# Patient Record
Sex: Female | Born: 1950 | Race: White | Hispanic: No | Marital: Married | Smoking: Former smoker
Health system: Southern US, Community
[De-identification: ages and names within clinical notes are randomized; demographics above are authoritative.]

## PROBLEM LIST (undated history)

## (undated) DIAGNOSIS — E785 Hyperlipidemia, unspecified: Secondary | ICD-10-CM

## (undated) DIAGNOSIS — E079 Disorder of thyroid, unspecified: Secondary | ICD-10-CM

## (undated) DIAGNOSIS — M8580 Other specified disorders of bone density and structure, unspecified site: Secondary | ICD-10-CM

## (undated) HISTORY — PX: TONSILLECTOMY: SUR1361

## (undated) HISTORY — DX: Other specified disorders of bone density and structure, unspecified site: M85.80

## (undated) HISTORY — PX: APPENDECTOMY: SHX54

## (undated) HISTORY — DX: Disorder of thyroid, unspecified: E07.9

## (undated) HISTORY — PX: MYOTOMY: SHX1013

## (undated) HISTORY — PX: RHINOPLASTY: SUR1284

## (undated) HISTORY — DX: Hyperlipidemia, unspecified: E78.5

---

## 2013-02-10 ENCOUNTER — Other Ambulatory Visit (HOSPITAL_COMMUNITY): Payer: Self-pay | Admitting: Obstetrics and Gynecology

## 2013-02-10 DIAGNOSIS — E041 Nontoxic single thyroid nodule: Secondary | ICD-10-CM

## 2013-02-10 DIAGNOSIS — E042 Nontoxic multinodular goiter: Secondary | ICD-10-CM

## 2013-02-15 ENCOUNTER — Ambulatory Visit (HOSPITAL_COMMUNITY): Payer: BC Managed Care – PPO

## 2013-04-12 ENCOUNTER — Ambulatory Visit (HOSPITAL_COMMUNITY)
Admission: RE | Admit: 2013-04-12 | Discharge: 2013-04-12 | Disposition: A | Discharge status: Home / Self Care | Payer: BC Managed Care – PPO | Source: Ambulatory Visit | Attending: Obstetrics and Gynecology | Admitting: Obstetrics and Gynecology

## 2013-04-12 DIAGNOSIS — E042 Nontoxic multinodular goiter: Secondary | ICD-10-CM | POA: Insufficient documentation

## 2013-04-12 DIAGNOSIS — E041 Nontoxic single thyroid nodule: Secondary | ICD-10-CM

## 2013-04-18 ENCOUNTER — Other Ambulatory Visit: Payer: Self-pay | Admitting: Obstetrics and Gynecology

## 2013-04-18 DIAGNOSIS — E041 Nontoxic single thyroid nodule: Secondary | ICD-10-CM

## 2013-04-27 ENCOUNTER — Other Ambulatory Visit (HOSPITAL_COMMUNITY)
Admission: RE | Admit: 2013-04-27 | Discharge: 2013-04-27 | Disposition: A | Discharge status: Home / Self Care | Payer: BC Managed Care – PPO | Source: Ambulatory Visit | Attending: Interventional Radiology | Admitting: Interventional Radiology

## 2013-04-27 ENCOUNTER — Ambulatory Visit
Admission: RE | Admit: 2013-04-27 | Discharge: 2013-04-27 | Disposition: A | Discharge status: Home / Self Care | Payer: BC Managed Care – PPO | Source: Ambulatory Visit | Attending: Obstetrics and Gynecology | Admitting: Obstetrics and Gynecology

## 2013-04-27 DIAGNOSIS — E041 Nontoxic single thyroid nodule: Secondary | ICD-10-CM

## 2013-04-27 DIAGNOSIS — E049 Nontoxic goiter, unspecified: Secondary | ICD-10-CM | POA: Insufficient documentation

## 2013-05-01 ENCOUNTER — Encounter (INDEPENDENT_AMBULATORY_CARE_PROVIDER_SITE_OTHER): Payer: Self-pay | Admitting: Surgery

## 2013-05-01 ENCOUNTER — Ambulatory Visit (INDEPENDENT_AMBULATORY_CARE_PROVIDER_SITE_OTHER): Payer: BC Managed Care – PPO | Admitting: Surgery

## 2013-05-01 VITALS — BP 128/82 | HR 68 | Temp 98.2°F | Resp 14 | Ht 62.5 in | Wt 156.4 lb

## 2013-05-01 DIAGNOSIS — E042 Nontoxic multinodular goiter: Secondary | ICD-10-CM | POA: Insufficient documentation

## 2013-05-01 NOTE — Progress Notes (Signed)
General Surgery [facility]  Chief Complaint  Patient presents with  . New Evaluation    multinodular goiter - referral from Dr. Arvella Nigh    HISTORY: Patient is a 64 year old female referred by her gynecologist for evaluation of multinodular thyroid goiter. This was originally diagnosed 8 years ago in [place]. Patient noted nodules on self-examination. She underwent ultrasound exam followed by fine-needle aspiration biopsy which is reportedly negative. Patient was then followed with sequential physical exam and ultrasound examination. Patient has never been on thyroid medication. She has had no surgery on the head or neck. She does have a family history of thyroid disease in her sister who is hypothyroid. There is no family history of thyroid malignancy. There is no history of endocrine neoplasms.  Patient describes rare dysphagia. She denies tremor. She denies palpitations.  Patient underwent thyroid ultrasound which shows a enlarged right lobe measuring 5.9 cm. Dominant nodules in the right lobe measuring 1.7 cm and 1.9 cm respectively. Left thyroid lobe is normal in size. Largest nodule on the left measures 1.1 cm. Patient subsequently underwent ultrasound-guided fine-needle aspiration biopsy of the right thyroid lobe nodule. Final cytopathology showed benign results consistent with nonneoplastic goiter.  Past Medical History  Diagnosis Date  . Osteopenia   . Thyroid disease   . Hyperlipidemia     Current Outpatient Prescriptions  Medication Sig Dispense Refill  . atorvastatin (LIPITOR) 10 MG tablet Take 10 mg by mouth daily.      . cetirizine (ZYRTEC) 5 MG tablet Take 5 mg by mouth daily.      . cycloSPORINE (RESTASIS) 0.05 % ophthalmic emulsion 1 drop 2 (two) times daily.      . Multiple Minerals-Vitamins (CALCIUM & VIT D3 BONE HEALTH PO) Take by mouth.       No current facility-administered medications for this visit.    No Known Allergies  Family  History  Problem Relation Age of Onset  . Melanoma Brother   . Breast cancer Sister   . Ovarian cancer Maternal Grandmother   . Colon cancer Paternal Grandfather   . Heart attack Father     History   Social History  . Marital Status: Married    Spouse Name: N/A    Number of Children: N/A  . Years of Education: N/A   Social History Main Topics  . Smoking status: Former Smoker    Quit date: 03/30/1978  . Smokeless tobacco: None  . Alcohol Use: Yes  . Drug Use: No  . Sexual Activity: None   Other Topics Concern  . None   Social History Narrative  . None    REVIEW OF SYSTEMS - PERTINENT POSITIVES ONLY: Rare dysphagia. Denies tremor. Denies palpitation. Denies new topical masses.  EXAM: Filed Vitals:   05/01/13 1140  BP: 128/82  Pulse: 68  Temp: 98.2 F (36.8 C)  Resp: 14    GENERAL: well-developed, well-nourished, no acute distress HEENT: normocephalic; pupils equal and reactive; sclerae clear; dentition good; mucous membranes moist NECK:  Palpable nodules right thyroid lobe, approximately 1 cm, mobile, firm, nontender; left thyroid lobe without palpable abnormality; symmetric on extension; no palpable anterior or posterior cervical lymphadenopathy; no supraclavicular masses; no tenderness CHEST: clear to auscultation bilaterally without rales, rhonchi, or wheezes CARDIAC: regular rate and rhythm without significant murmur; peripheral pulses are full EXT:  non-tender without edema; no deformity NEURO: no gross focal deficits; no sign of tremor   LABORATORY RESULTS: See [facility] (CHL-Epic) for most recent results  RADIOLOGY RESULTS: See [facility] (CHL-Epic) for most recent results  IMPRESSION: Bilateral thyroid nodules, benign cytopathology, clinically stable  PLAN: The patient and I discussed options for management. At this point I think it is safe to observe her with annual ultrasound examination and TSH determination and physical examination.  We discussed other options for management including thyroid hormone suppression and thyroid surgery. Patient is comfortable with continued observation. We will make arrangements for follow-up in one year.  Earnstine Regal, MD, [facility]  Primary Care Physician: Darlyn Chamber, MD

## 2013-05-01 NOTE — Patient Instructions (Signed)

## 2013-05-11 ENCOUNTER — Encounter (INDEPENDENT_AMBULATORY_CARE_PROVIDER_SITE_OTHER): Payer: Self-pay | Admitting: Obstetrics and Gynecology

## 2014-04-19 ENCOUNTER — Other Ambulatory Visit: Payer: Self-pay | Admitting: Obstetrics and Gynecology

## 2014-04-20 LAB — CYTOLOGY - PAP

## 2015-03-01 ENCOUNTER — Other Ambulatory Visit: Payer: Self-pay | Admitting: Surgery

## 2015-03-01 DIAGNOSIS — E042 Nontoxic multinodular goiter: Secondary | ICD-10-CM

## 2015-03-19 DIAGNOSIS — E782 Mixed hyperlipidemia: Secondary | ICD-10-CM | POA: Diagnosis not present

## 2015-03-19 DIAGNOSIS — K7689 Other specified diseases of liver: Secondary | ICD-10-CM | POA: Diagnosis not present

## 2015-03-19 DIAGNOSIS — R7301 Impaired fasting glucose: Secondary | ICD-10-CM | POA: Diagnosis not present

## 2015-03-19 DIAGNOSIS — E042 Nontoxic multinodular goiter: Secondary | ICD-10-CM | POA: Diagnosis not present

## 2015-04-05 ENCOUNTER — Ambulatory Visit
Admission: RE | Admit: 2015-04-05 | Discharge: 2015-04-05 | Disposition: A | Discharge status: Home / Self Care | Payer: Self-pay | Source: Ambulatory Visit | Attending: Surgery | Admitting: Surgery

## 2015-04-05 DIAGNOSIS — E042 Nontoxic multinodular goiter: Secondary | ICD-10-CM

## 2015-05-06 DIAGNOSIS — E042 Nontoxic multinodular goiter: Secondary | ICD-10-CM | POA: Diagnosis not present

## 2015-06-13 DIAGNOSIS — S0500XA Injury of conjunctiva and corneal abrasion without foreign body, unspecified eye, initial encounter: Secondary | ICD-10-CM | POA: Diagnosis not present

## 2015-06-13 DIAGNOSIS — H16223 Keratoconjunctivitis sicca, not specified as Sjogren's, bilateral: Secondary | ICD-10-CM | POA: Diagnosis not present

## 2015-06-13 DIAGNOSIS — H11152 Pinguecula, left eye: Secondary | ICD-10-CM | POA: Diagnosis not present

## 2015-10-25 DIAGNOSIS — M8580 Other specified disorders of bone density and structure, unspecified site: Secondary | ICD-10-CM | POA: Diagnosis not present

## 2015-10-25 DIAGNOSIS — Z0000 Encounter for general adult medical examination without abnormal findings: Secondary | ICD-10-CM | POA: Diagnosis not present

## 2015-10-25 DIAGNOSIS — E042 Nontoxic multinodular goiter: Secondary | ICD-10-CM | POA: Diagnosis not present

## 2015-10-25 DIAGNOSIS — E782 Mixed hyperlipidemia: Secondary | ICD-10-CM | POA: Diagnosis not present

## 2015-11-25 ENCOUNTER — Encounter (HOSPITAL_COMMUNITY): Payer: Self-pay | Admitting: Emergency Medicine

## 2015-11-25 ENCOUNTER — Emergency Department (HOSPITAL_COMMUNITY): Payer: Medicare Other

## 2015-11-25 ENCOUNTER — Emergency Department (HOSPITAL_COMMUNITY)
Admission: EM | Admit: 2015-11-25 | Discharge: 2015-11-25 | Disposition: A | Discharge status: Home / Self Care | Payer: Medicare Other | Attending: Emergency Medicine | Admitting: Emergency Medicine

## 2015-11-25 DIAGNOSIS — W010XXA Fall on same level from slipping, tripping and stumbling without subsequent striking against object, initial encounter: Secondary | ICD-10-CM | POA: Diagnosis not present

## 2015-11-25 DIAGNOSIS — S92351A Displaced fracture of fifth metatarsal bone, right foot, initial encounter for closed fracture: Secondary | ICD-10-CM | POA: Diagnosis not present

## 2015-11-25 DIAGNOSIS — M7989 Other specified soft tissue disorders: Secondary | ICD-10-CM | POA: Diagnosis not present

## 2015-11-25 DIAGNOSIS — Y929 Unspecified place or not applicable: Secondary | ICD-10-CM | POA: Diagnosis not present

## 2015-11-25 DIAGNOSIS — S99911A Unspecified injury of right ankle, initial encounter: Secondary | ICD-10-CM | POA: Diagnosis present

## 2015-11-25 DIAGNOSIS — S92301A Fracture of unspecified metatarsal bone(s), right foot, initial encounter for closed fracture: Secondary | ICD-10-CM

## 2015-11-25 DIAGNOSIS — Z87891 Personal history of nicotine dependence: Secondary | ICD-10-CM | POA: Insufficient documentation

## 2015-11-25 DIAGNOSIS — Y999 Unspecified external cause status: Secondary | ICD-10-CM | POA: Insufficient documentation

## 2015-11-25 DIAGNOSIS — Y939 Activity, unspecified: Secondary | ICD-10-CM | POA: Diagnosis not present

## 2015-11-25 DIAGNOSIS — M25571 Pain in right ankle and joints of right foot: Secondary | ICD-10-CM | POA: Diagnosis not present

## 2015-11-25 MED ORDER — TRAMADOL HCL 50 MG PO TABS
50.0000 mg | ORAL_TABLET | Freq: Once | ORAL | Status: AC
Start: 2015-11-25 — End: 2015-11-25
  Administered 2015-11-25: 50 mg via ORAL
  Filled 2015-11-25: qty 1

## 2015-11-25 MED ORDER — TRAMADOL HCL 50 MG PO TABS: 50.0000 mg | ORAL_TABLET | Freq: Four times a day (QID) | ORAL | 0 refills | Status: DC | PRN

## 2015-11-25 NOTE — Discharge Instructions (Signed)
Take the prescribed medication as directed.  You may supplement this with Tylenol or Motrin in between doses if needed. Recommend to ice and elevate right foot several times a day for 20 minutes at a time.  This should help with swelling and pain. Follow-up with Dr. Ninfa Linden next week-- call his office to make an appointment. Return to the ED for new or worsening symptoms.

## 2015-11-25 NOTE — ED Provider Notes (Signed)
[facility] DEPT Provider Note   CSN: [id number] Arrival date & time: 11/25/15  1053  By signing my name below, I, Judithe Modest, attest that this documentation has been prepared under the direction and in the presence of Quincy Carnes, PA-C. Electronically Signed: Judithe Modest, ER Scribe. 11/09/2015. 11:45 AM.   History   Chief Complaint Chief Complaint  Patient presents with  . Ankle Pain    The history is provided by the patient. No language interpreter was used.    HPI Comments: Janet Holland is a 65 y.o. female who presents to the Emergency Department complaining of right ankle pain after twisting her ankle when she tripped over her rumba robotic vacuum. She has no past hx of ankle breaks. She has had a bunion surgically removed from her left foot.  No numbness or weakness of right foot.  States she has not been able to ambulate or bear weight on the right foot since injury.   Past Medical History:  Diagnosis Date  . Hyperlipidemia   . Osteopenia   . Thyroid disease     Patient Active Problem List   Diagnosis Date Noted  . 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 APPENDECTOMY    . MYOTOMY    . RHINOPLASTY    . TONSILLECTOMY      OB History    No data available       Home Medications    Prior to Admission medications   Medication Sig Start Date End Date Taking? Authorizing Provider  atorvastatin (LIPITOR) 10 MG tablet Take 10 mg by mouth daily.    Historical Provider, MD  cetirizine (ZYRTEC) 5 MG tablet Take 5 mg by mouth daily.    Historical Provider, MD  cycloSPORINE (RESTASIS) 0.05 % ophthalmic emulsion 1 drop 2 (two) times daily.    Historical Provider, MD  Multiple Minerals-Vitamins (CALCIUM & VIT D3 BONE HEALTH PO) Take by mouth.    Historical Provider, MD    Family History Family History  Problem Relation Age of Onset  . Melanoma Brother   . Breast cancer Sister   . Ovarian  cancer Maternal Grandmother   . Colon cancer Paternal Grandfather   . Heart attack Father     Social History Social History  Substance Use Topics  . Smoking status: Former Smoker    Quit date: 03/30/1978  . Smokeless tobacco: Not on file  . Alcohol use Yes     Allergies   Review of patient's allergies indicates no known allergies.   Review of Systems Review of Systems  Musculoskeletal: Positive for arthralgias and joint swelling.  Skin: Negative for color change and wound.  Neurological: Negative for numbness.     Physical Exam Updated Vital Signs BP 144/93 (BP Location: Left Arm)   Pulse 71   Temp 98.4 F (36.9 C) (Oral)   Resp 18   Ht 5\' 3"  (1.6 m)   Wt 151 lb (68.5 kg)   SpO2 100%   BMI 26.75 kg/m   Physical Exam  Constitutional: She is oriented to person, place, and time. She appears well-developed and well-nourished.  HENT:  Head: Normocephalic and atraumatic.  Mouth/Throat: Oropharynx is clear and moist.  Eyes: Conjunctivae and EOM are normal. Pupils are equal, round, and reactive to light.  Neck: Normal range of motion.  Cardiovascular: Normal rate, regular rhythm and normal heart sounds.   Pulmonary/Chest: Effort normal and breath sounds normal.  Abdominal: Soft.  Bowel sounds are normal.  Musculoskeletal: Normal range of motion.  Right foot with swelling and bruising along the fifth metacarpal, worse at the base; there is a slight deformity noted; no skin tenting, compartments soft and easily compressible; DP pulse intact; moving all toes appropriately; normal sensation throughout right foot  Neurological: She is alert and oriented to person, place, and time.  Skin: Skin is warm and dry.  Psychiatric: She has a normal mood and affect.  Nursing note and vitals reviewed.    ED Treatments / Results  DIAGNOSTIC STUDIES: Oxygen Saturation is 100% on RA, normal by my interpretation.    COORDINATION OF CARE: 11:45 AM Discussed treatment plan with pt at  bedside which includes x-rays and pain management and pt agreed to plan.  Labs (all labs ordered are listed, but only abnormal results are displayed) Labs Reviewed - No data to display  EKG  EKG Interpretation None       Radiology Dg Ankle Complete Right  Result Date: 11/25/2015 CLINICAL DATA:  Right foot and ankle pain and swelling after tripping over a vacuum cleaner and falling. EXAM: RIGHT ANKLE - COMPLETE 3+ VIEW COMPARISON:  Right foot radiographs obtained at the same time. FINDINGS: Mild diffuse soft tissue swelling. Moderate anterior and mild posterior calcaneal spur formation. No ankle fracture or dislocation. IMPRESSION: No ankle fracture.  Calcaneal spurs. Electronically Signed   By: Claudie Revering M.D.   On: 11/25/2015 12:21   Dg Foot Complete Right  Result Date: 11/25/2015 CLINICAL DATA:  Right foot and ankle pain after tripping over a vacuum cleaner and falling. EXAM: RIGHT FOOT COMPLETE - 3+ VIEW COMPARISON:  Right ankle radiographs obtained at the same time. FINDINGS: Comminuted fracture of the mid to distal shaft of the fifth metatarsal with 1 shaft width of dorsal and medial displacement of the distal fragment and lateral and ventral angulation of the distal fragment. Previously described calcaneal spurs. IMPRESSION: Comminuted fifth metatarsal fracture, as described above. Electronically Signed   By: Claudie Revering M.D.   On: 11/25/2015 12:22    Procedures Procedures (including critical care time)  Medications Ordered in ED Medications  traMADol (ULTRAM) tablet 50 mg (50 mg Oral Given 11/25/15 1147)     Initial Impression / Assessment and Plan / ED Course  I have reviewed the triage vital signs and the nursing notes.  Pertinent labs & imaging results that were available during my care of the patient were reviewed by me and considered in my medical decision making (see chart for details).  Clinical Course   65 year old female here with a fall over a robotic vacuum  this morning. No head injury or loss of consciousness. She does have swelling, bruising, and tenderness along the right fifth metacarpal, worse at the base. There is no skin tenting, compartments are soft and easily compressible. Her foot is neurovascularly intact. X-ray confirms comminuted right fifth metatarsal fracture. Given the nature of her fracture, discussed with orthopedics (Dr. Ninfa Linden)-- recommends CAM walker with crutches, follow-up in his clinic in 1 week.  Rx tramadol for pain as patient responded well to this in the ED.  Discussed plan with patient, she acknowledged understanding and agreed with plan of care.  Return precautions given for new or worsening symptoms.  Final Clinical Impressions(s) / ED Diagnoses   Final diagnoses:  Metatarsal fracture, right, closed, initial encounter    New Prescriptions New Prescriptions   No medications on file    I personally performed the services described in this  documentation, which was scribed in my presence. The recorded information has been reviewed and is accurate.      Larene Pickett, PA-C 11/25/15 [place], MD 11/25/15 [phone]

## 2015-11-25 NOTE — ED Triage Notes (Signed)
Pt states "the robot vacuum made me trip and I twisted my right ankle".

## 2015-11-29 DIAGNOSIS — S92351A Displaced fracture of fifth metatarsal bone, right foot, initial encounter for closed fracture: Secondary | ICD-10-CM | POA: Diagnosis not present

## 2015-12-10 DIAGNOSIS — S92351D Displaced fracture of fifth metatarsal bone, right foot, subsequent encounter for fracture with routine healing: Secondary | ICD-10-CM | POA: Diagnosis not present

## 2015-12-31 ENCOUNTER — Ambulatory Visit (INDEPENDENT_AMBULATORY_CARE_PROVIDER_SITE_OTHER): Payer: Medicare Other | Admitting: Orthopedic Surgery

## 2015-12-31 DIAGNOSIS — S92354A Nondisplaced fracture of fifth metatarsal bone, right foot, initial encounter for closed fracture: Secondary | ICD-10-CM | POA: Diagnosis not present

## 2016-01-22 ENCOUNTER — Ambulatory Visit (INDEPENDENT_AMBULATORY_CARE_PROVIDER_SITE_OTHER): Payer: Medicare Other | Admitting: Orthopedic Surgery

## 2016-01-22 ENCOUNTER — Ambulatory Visit (INDEPENDENT_AMBULATORY_CARE_PROVIDER_SITE_OTHER): Payer: Medicare Other | Admitting: Family

## 2016-01-22 ENCOUNTER — Ambulatory Visit (INDEPENDENT_AMBULATORY_CARE_PROVIDER_SITE_OTHER): Payer: Medicare Other

## 2016-01-22 ENCOUNTER — Encounter (INDEPENDENT_AMBULATORY_CARE_PROVIDER_SITE_OTHER): Payer: Self-pay | Admitting: Family

## 2016-01-22 VITALS — Ht 63.0 in | Wt 150.0 lb

## 2016-01-22 DIAGNOSIS — M79671 Pain in right foot: Secondary | ICD-10-CM

## 2016-01-22 DIAGNOSIS — S92354D Nondisplaced fracture of fifth metatarsal bone, right foot, subsequent encounter for fracture with routine healing: Secondary | ICD-10-CM

## 2016-01-22 NOTE — Progress Notes (Signed)
   Office Visit Note   Patient: Janet Holland           Date of Birth: July 01, 1950           MRN: [id number] Visit Date: 01/22/2016              Requested by: Arvella Nigh, MD [address], [place] [address] PCP: Darlyn Chamber, MD   Assessment & Plan: Visit Diagnoses:  1. Closed nondisplaced fracture of fifth metatarsal bone of right foot with routine healing, subsequent encounter   2. Pain in right foot     Plan: Given a post op shoe. Will advance weight bearing in post op shoe. States will be purchasing some [employer] walking shoes with stiff soles. May advance weight bearing in these as tolerated. Will follow up in 2 more weeks. Anticipate releasing her with no restrictions at that time.   Follow-Up Instructions: Return in about 2 weeks (around 02/05/2016).   Orders:  Orders Placed This Encounter  Procedures  . XR Foot Complete Right       Procedures: No procedures performed   Clinical Data: No additional findings.   Subjective: Chief Complaint  Patient presents with  . Right Foot - Fracture  . Follow-up    5th metatarsal fracture rt foot DOI 11/25/15    Patient presents with follow up right 5th metatarsal fracture of right foot. She is approximately 8 weeks out from initial injury. She is feeling good majority of the time, she has burning sensation on occasion. She ambulates full weightbearing with fracture boot. Overall she is doing well without complications.    Review of Systems  Constitutional: Negative for chills and fever.     Objective: Vital Signs: Ht 5\' 3"  (1.6 m)   Wt 150 lb (68 kg)   BMI 26.57 kg/m   Physical Exam  Ortho Exam  Right foot: Minimal swelling. DP is 2+. Does have continued minimal tenderness over the shaft of hte 5th metatarsal. No erythema or skin breakdown.   Specialty Comments:  No specialty comments available.  Imaging: Xr Foot Complete Right  Result Date: 01/22/2016 Three view radiographs of  the right foot show bony callus formation along the 5th metatarsal fracture. Interval healing seen.    PMFS History: Patient Active Problem List   Diagnosis Date Noted  . 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 Hyperlipidemia   . Osteopenia   . Thyroid disease     Family History  Problem Relation Age of Onset  . Melanoma Brother   . Breast cancer Sister   . Ovarian cancer Maternal Grandmother   . Colon cancer Paternal Grandfather   . Heart attack Father     Past Surgical History:  Procedure Laterality Date  . APPENDECTOMY    . MYOTOMY    . RHINOPLASTY    . TONSILLECTOMY     Social History   Occupational History  . Not on file.   Social History Main Topics  . Smoking status: Former Smoker    Quit date: 03/30/1978  . Smokeless tobacco: Never Used  . Alcohol use Yes  . Drug use: No  . Sexual activity: Not on file

## 2016-01-27 ENCOUNTER — Telehealth (INDEPENDENT_AMBULATORY_CARE_PROVIDER_SITE_OTHER): Payer: Self-pay | Admitting: *Deleted

## 2016-01-27 NOTE — Telephone Encounter (Signed)
Pt. Is bringing a cover sheet in the office today but states she needs a return to work note. Pt needs note to say she is released to work effective 10/30. Fax number is [phone].  Pt. Called stating her post OP shoe is hurting and wanted to know if she was able to put a insole in it. Pt. Requesting call back.

## 2016-01-27 NOTE — Telephone Encounter (Signed)
You have paperwork

## 2016-01-27 NOTE — Telephone Encounter (Signed)
I called spoke with patient to advise note/form was faxed and emailed [phone] and emailed to [email or web address]. This was per patient request. Advised her on the phone that she can wear insole if this gives her better comfort.

## 2016-02-11 ENCOUNTER — Encounter (INDEPENDENT_AMBULATORY_CARE_PROVIDER_SITE_OTHER): Payer: Self-pay | Admitting: Orthopedic Surgery

## 2016-02-11 ENCOUNTER — Ambulatory Visit (INDEPENDENT_AMBULATORY_CARE_PROVIDER_SITE_OTHER): Payer: Self-pay

## 2016-02-11 ENCOUNTER — Ambulatory Visit (INDEPENDENT_AMBULATORY_CARE_PROVIDER_SITE_OTHER): Payer: Medicare Other | Admitting: Orthopedic Surgery

## 2016-02-11 VITALS — Ht 63.0 in | Wt 150.0 lb

## 2016-02-11 DIAGNOSIS — M79671 Pain in right foot: Secondary | ICD-10-CM

## 2016-02-11 NOTE — Progress Notes (Signed)
   Office Visit Note   Patient: Janet Holland           Date of Birth: 1950/07/05           MRN: [id number] Visit Date: 02/11/2016              Requested by: Arvella Nigh, MD [address] [place], [place] [address] PCP: [facility]   Assessment & Plan: Visit Diagnoses:  1. Pain in right foot     Plan: Advance weightbearing and walking shoes. May advance to regular shoewear as feels comfortable.  Follow-Up Instructions: Return if symptoms worsen or fail to improve.   Orders:  No orders of the defined types were placed in this encounter.  No orders of the defined types were placed in this encounter.     Procedures: No procedures performed   Clinical Data: No additional findings.   Subjective: Chief Complaint  Patient presents with  . Left Foot - Follow-up    DOI 11/25/15 5th MT fracture right foot.    Patient is in for evaluation of her 5th metatarsal fracture of the right foot. The injury took place 11/25/15. She is in a post op shoe full weight bearing and does not have any questions or concerns at this time.   States is ready to resume a regular shoewear. Has [employer] walking shoes with stiff soles.  Review of Systems  Constitutional: Negative for chills and fever.     Objective: Vital Signs: Ht 5\' 3"  (1.6 m)   Wt 150 lb (68 kg)   BMI 26.57 kg/m   Physical Exam  Ortho Exam Steady gait in postop shoe. There is minimal swelling to the right foot. No erythema or open areas. Minimal tenderness to palpation over distal fourth and fifth metatarsals. Good dorsiflexion of the ankle. 2+ DP pulse. Specialty Comments:  No specialty comments available.  Imaging: No results found.   PMFS History: Patient Active Problem List   Diagnosis Date Noted  . 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 Hyperlipidemia   . Osteopenia   . Thyroid disease     Family History  Problem  Relation Age of Onset  . Melanoma Brother   . Breast cancer Sister   . Ovarian cancer Maternal Grandmother   . Colon cancer Paternal Grandfather   . Heart attack Father     Past Surgical History:  Procedure Laterality Date  . APPENDECTOMY    . MYOTOMY    . RHINOPLASTY    . TONSILLECTOMY     Social History   Occupational History  . Not on file.   Social History Main Topics  . Smoking status: Former Smoker    Quit date: 03/30/1978  . Smokeless tobacco: Never Used  . Alcohol use Yes  . Drug use: No  . Sexual activity: Not on file

## 2016-05-01 ENCOUNTER — Encounter (INDEPENDENT_AMBULATORY_CARE_PROVIDER_SITE_OTHER): Payer: Self-pay | Admitting: Orthopedic Surgery

## 2016-05-01 ENCOUNTER — Ambulatory Visit (INDEPENDENT_AMBULATORY_CARE_PROVIDER_SITE_OTHER): Payer: Medicare Other

## 2016-05-01 ENCOUNTER — Ambulatory Visit (INDEPENDENT_AMBULATORY_CARE_PROVIDER_SITE_OTHER): Payer: Medicare Other | Admitting: Orthopedic Surgery

## 2016-05-01 VITALS — Ht 63.0 in | Wt 150.0 lb

## 2016-05-01 DIAGNOSIS — M79671 Pain in right foot: Secondary | ICD-10-CM

## 2016-05-01 DIAGNOSIS — S93621A Sprain of tarsometatarsal ligament of right foot, initial encounter: Secondary | ICD-10-CM | POA: Diagnosis not present

## 2016-05-01 NOTE — Progress Notes (Signed)
   Office Visit Note   Patient: Janet Holland           Date of Birth: May 13, 1950           MRN: [id number] Visit Date: 05/01/2016              Requested by: [place] [phone] [place], [place] [address] PCP: [facility]  Chief Complaint  Patient presents with  . Right Foot - Pain    HPI: Patient was outside yesterday evening and fell outside landing on right hip and injuring right foot. She has prior history of 5th metatarsal fracture. She is having pain medially and dorsal aspect of foot. There is minimal swelling. There is bruising at right hip, she is full weightbearing. She is requesting a prescription for diclofenac gel today. Maxcine Ham, RT    Assessment & Plan: Visit Diagnoses:  1. Pain in right foot   2. Lisfranc's sprain, right, initial encounter     Plan: Patient will be placed in a fracture boot and follow-up in 4 weeks with   repeat radiographs of three-view right foot at follow-up.   Discussed that if the sprain the Lisfranc complex does not resolve on its own internal fixation is an option.  Follow-Up Instructions: Return in about 4 weeks (around 05/29/2016).   Ortho Exam On examination patient is alert oriented no adenopathy well-dressed normal affect normal wrist where she does have an antalgic gait she has good pulses she has good ankle and subtalar motion the fifth metatarsals nontender to palpation. She is directly tender to palpation over the Lisfranc complex. Distraction across the Lisfranc complex reproduces pain.  Imaging: Xr Foot Complete Right  Result Date: 05/01/2016 Three-view radiographs of the right foot shows no widening across the Lisfranc complex there is no displacement. No evidence of a metatarsal fracture her all metatarsal fracture has healed well.   Orders:  Orders Placed This Encounter  Procedures  . XR Foot Complete Right   No orders of the defined types were  placed in this encounter.    Procedures: No procedures performed  Clinical Data: No additional findings.  Subjective: Review of Systems  Objective: Vital Signs: Ht 5\' 3"  (1.6 m)   Wt 150 lb (68 kg)   BMI 26.57 kg/m   Specialty Comments:  No specialty comments available.  PMFS History: Patient Active Problem List   Diagnosis Date Noted  . Lisfranc's sprain, right, initial encounter 05/01/2016  . Pain in right foot 05/01/2016  . 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 Hyperlipidemia   . Osteopenia   . Thyroid disease     Family History  Problem Relation Age of Onset  . Melanoma Brother   . Breast cancer Sister   . Ovarian cancer Maternal Grandmother   . Colon cancer Paternal Grandfather   . Heart attack Father     Past Surgical History:  Procedure Laterality Date  . APPENDECTOMY    . MYOTOMY    . RHINOPLASTY    . TONSILLECTOMY     Social History   Occupational History  . Not on file.   Social History Main Topics  . Smoking status: Former Smoker    Quit date: 03/30/1978  . Smokeless tobacco: Never Used  . Alcohol use Yes  . Drug use: No  . Sexual activity: Not on file

## 2016-05-29 ENCOUNTER — Ambulatory Visit (INDEPENDENT_AMBULATORY_CARE_PROVIDER_SITE_OTHER): Payer: Medicare Other | Admitting: Orthopedic Surgery

## 2016-05-29 ENCOUNTER — Encounter (INDEPENDENT_AMBULATORY_CARE_PROVIDER_SITE_OTHER): Payer: Self-pay | Admitting: Orthopedic Surgery

## 2016-05-29 ENCOUNTER — Ambulatory Visit (INDEPENDENT_AMBULATORY_CARE_PROVIDER_SITE_OTHER): Payer: Medicare Other

## 2016-05-29 DIAGNOSIS — S93621D Sprain of tarsometatarsal ligament of right foot, subsequent encounter: Secondary | ICD-10-CM

## 2016-05-29 NOTE — Progress Notes (Signed)
   Office Visit Note   Patient: Janet Holland           Date of Birth: [birth year]           MRN: [id number] Visit Date: 05/29/2016              Requested by: [place] [phone] [place], [place] [address] PCP: [facility]  Chief Complaint  Patient presents with  . Right Foot - Follow-up    Lisfranc sprain    HPI: Patient is a 67 y.o woman who presents today for follow up right lisfranc sprain from 04/30/16. She is ambulating with cam walker. She states she does feel better with boot on. Maxcine Ham, RT    Assessment & Plan: Visit Diagnoses:  1. Lisfranc's sprain, right, subsequent encounter     Plan: Continue her fracture boot for 2 more weeks follow-up without radiographs. Patient is making good progress with decreased symptoms. Anticipate this should heal without surgical intervention.  Follow-Up Instructions: Return in about 2 weeks (around 06/12/2016).   Ortho Exam Examination patient is alert or any no adenopathy well-dressed normal affect, respiratory effort she has an antalgic gait. She has good pulses good ankle and subtalar motion. She does have some pain to palpation of the Lisfranc complex but this is much less than before. ROS: Complete review of systems negative except as mentioned in the history of present illness Imaging: Xr Foot Complete Right  Result Date: 05/29/2016 Three-view radiographs the right foot shows no displacement across the Lisfranc complex she has an old fracture of the fifth metatarsal no new fractures.   Labs: No results found for: HGBA1C, ESRSEDRATE, CRP, LABURIC, REPTSTATUS, GRAMSTAIN, CULT, LABORGA  Orders:  Orders Placed This Encounter  Procedures  . XR Foot Complete Right   No orders of the defined types were placed in this encounter.    Procedures: No procedures performed  Clinical Data: No additional findings.  Subjective: Review of  Systems  Objective: Vital Signs: There were no vitals taken for this visit.  Specialty Comments:  No specialty comments available.  PMFS History: Patient Active Problem List   Diagnosis Date Noted  . Lisfranc's sprain, right, initial encounter 05/01/2016  . Pain in right foot 05/01/2016  . 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 Hyperlipidemia   . Osteopenia   . Thyroid disease     Family History  Problem Relation Age of Onset  . Melanoma Brother   . Breast cancer Sister   . Ovarian cancer Maternal Grandmother   . Colon cancer Paternal Grandfather   . Heart attack Father     Past Surgical History:  Procedure Laterality Date  . APPENDECTOMY    . MYOTOMY    . RHINOPLASTY    . TONSILLECTOMY     Social History   Occupational History  . Not on file.   Social History Main Topics  . Smoking status: Former Smoker    Quit date: 03/30/1978  . Smokeless tobacco: Never Used  . Alcohol use Yes  . Drug use: No  . Sexual activity: Not on file

## 2016-06-19 ENCOUNTER — Ambulatory Visit (INDEPENDENT_AMBULATORY_CARE_PROVIDER_SITE_OTHER): Payer: Medicare Other | Admitting: Orthopedic Surgery

## 2016-06-19 ENCOUNTER — Encounter (INDEPENDENT_AMBULATORY_CARE_PROVIDER_SITE_OTHER): Payer: Self-pay | Admitting: Orthopedic Surgery

## 2016-06-19 DIAGNOSIS — S93621A Sprain of tarsometatarsal ligament of right foot, initial encounter: Secondary | ICD-10-CM | POA: Diagnosis not present

## 2016-06-19 DIAGNOSIS — R269 Unspecified abnormalities of gait and mobility: Secondary | ICD-10-CM | POA: Diagnosis not present

## 2016-06-19 NOTE — Progress Notes (Signed)
   Office Visit Note   Patient: Janet Holland           Date of Birth: 07-13-1950           MRN: [id number] Visit Date: 06/19/2016              Requested by: [place] [phone], [place] [address] PCP: [place]  Chief Complaint  Patient presents with  . Right Foot - Follow-up    Lisfranc Sprain 04/30/16    HPI: The patient is a 66 year old woman seen in follow up for lisfranc sprain right foot. Has been in a fracture boot. No pain with ambulation. Has trialed full weight bearing in a Dansko clog. This is pain free. Does notice she has trouble with stairs is still limping. Wonders when she will regain a normal gait. Would like to do some physical therapy.   Assessment & Plan: Visit Diagnoses:  1. Gait abnormality   2. Lisfranc's sprain, right, initial encounter     Plan: will send an order for PT of [facility] per her request. Follow up in office as needed. Resume regular shoe wear. Activities as tolerated.  Follow-Up Instructions: Return if symptoms worsen or fail to improve.   Ortho Exam Physical Exam  Constitutional: Appears well-developed.  Head: Normocephalic.  Eyes: EOM are normal.  Neck: Normal range of motion.  Cardiovascular: Normal rate.   Pulmonary/Chest: Effort normal.  Neurological: Is alert.  Skin: Skin is warm.  Psychiatric: Has a normal mood and affect. Right foot: base of 5th MT is nontender. MTs and lisfranc complex nontender. Full painless rom right foot. No swelling or erythema.  Imaging: No results found.  Labs: No results found for: HGBA1C, ESRSEDRATE, CRP, LABURIC, REPTSTATUS, GRAMSTAIN, CULT, LABORGA  Orders:  No orders of the defined types were placed in this encounter.  No orders of the defined types were placed in this encounter.    Procedures: No procedures performed  Clinical Data: No additional findings.  ROS: Review of Systems  Constitutional: Negative for  chills and fever.  Musculoskeletal: Positive for gait problem and myalgias.    Objective: Vital Signs: There were no vitals taken for this visit.  Specialty Comments:  No specialty comments available.  PMFS History: Patient Active Problem List   Diagnosis Date Noted  . Lisfranc's sprain, right, initial encounter 05/01/2016  . Pain in right foot 05/01/2016  . 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 Hyperlipidemia   . Osteopenia   . Thyroid disease     Family History  Problem Relation Age of Onset  . Melanoma Brother   . Breast cancer Sister   . Ovarian cancer Maternal Grandmother   . Colon cancer Paternal Grandfather   . Heart attack Father     Past Surgical History:  Procedure Laterality Date  . APPENDECTOMY    . MYOTOMY    . RHINOPLASTY    . TONSILLECTOMY     Social History   Occupational History  . Not on file.   Social History Main Topics  . Smoking status: Former Smoker    Quit date: 03/30/1978  . Smokeless tobacco: Never Used  . Alcohol use Yes  . Drug use: No  . Sexual activity: Not on file

## 2016-06-30 ENCOUNTER — Encounter: Payer: Self-pay | Admitting: Physical Therapy

## 2016-06-30 ENCOUNTER — Ambulatory Visit: Payer: Medicare Other | Attending: Family | Admitting: Physical Therapy

## 2016-06-30 DIAGNOSIS — M25571 Pain in right ankle and joints of right foot: Secondary | ICD-10-CM | POA: Insufficient documentation

## 2016-06-30 DIAGNOSIS — R2689 Other abnormalities of gait and mobility: Secondary | ICD-10-CM | POA: Diagnosis not present

## 2016-06-30 NOTE — Patient Instructions (Signed)
Inversion: Resisted   Cross legs with right leg underneath, foot in tubing loop. Hold tubing around other foot to resist and turn foot in. Repeat __10-20__ times per set. Do _1-2___ sets per session. Do ___2_ sessions per day.  http://orth.exer.us/12   Copyright  VHI. All rights reserved.  Eversion: Resisted   With right foot in tubing loop, hold tubing around other foot to resist and turn foot out. Repeat __10-20__ times per set. Do __1-2__ sets per session. Do __2__ sessions per day.  http://orth.exer.us/14    Gastroc / Heel Cord Stretch - On Step    Stand with heels over edge of stair. Holding rail, lower heels until stretch is felt in calf of legs. Repeat _3, 30 sec __ times. Do __2_ times per day.  Copyright  VHI. All rights reserved.  Ankle: Calf Raise    Stand with forefoot on step, heels toward floor. Raise heels as far as possible. Use hand support for balance. Repeat ___10_ times per set. Do __1-2__ sets per session. Do _5__ sessions per week.  Copyright  VHI. All rights reserved.

## 2016-06-30 NOTE — Therapy (Signed)
[place], [place], [address] Phone: [phone]   Fax:  [phone]  Physical Therapy Evaluation  Patient Details  Name: Janet Holland MRN: [id number] Date of Birth: 02-22-1951 Referring Provider: Dr. Meridee Score  Encounter Date: 06/30/2016      PT End of Session - 06/30/16 1132    Visit Number 1   Number of Visits 12   Date for PT Re-Evaluation 08/11/16   PT Start Time 0937   PT Stop Time 1020   PT Time Calculation (min) 43 min   Activity Tolerance Patient tolerated treatment well   Behavior During Therapy [facility] for tasks assessed/performed      Past Medical History:  Diagnosis Date  . Hyperlipidemia   . Osteopenia   . Thyroid disease     Past Surgical History:  Procedure Laterality Date  . APPENDECTOMY    . MYOTOMY    . RHINOPLASTY    . TONSILLECTOMY      There were no vitals filed for this visit.       Subjective Assessment - 06/30/16 0940    Subjective Pt injured her Rt. foot (5th met fracture) in Aug 2017.  She wore a boot for 3 mos and returned to walking without incident.  Per XR her foot is healed.  She then in Jan suffered a Lisfranc sprain while walking her dog (twisted her ankle and fell). She cont to have gait issues. She complains of pressure in lateral foot (5th met).  She has difficulty walking without a limp, negotiating steps (up is harder to do than down).  She can't walk the dog as she used to (3/4 of a mile).  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  She would like to walk normally.       Pertinent History Fracture of 5th metatarsal (aug 2017) Lisfranc sprain Jan 2018.   Osteopenia    Limitations House hold activities;Lifting;Walking  stairs, work    How long can you stand comfortably? 1-2 hours    How long can you walk comfortably? 30 min    Diagnostic tests XR   Patient Stated Goals Pt would like to be able to walk  better, garden, confidence    Currently in Pain? No/denies   Pain Score 1    Pain Location Foot   Pain Orientation Right   Pain Descriptors / Indicators Discomfort;Tightness   Pain Type Chronic pain   Pain Onset More than a month ago   Pain Frequency Intermittent   Aggravating Factors  walking, twisting it    Pain Relieving Factors ignore it    Effect of Pain on Daily Activities see above, limp   Multiple Pain Sites No            [facility] PT Assessment - 06/30/16 0948      Assessment   Medical Diagnosis R foot sprain, gait    Referring Provider Dr. Meridee Score   Onset Date/Surgical Date 11/25/15   Prior Therapy No      Precautions   Precautions None     Restrictions   Weight Bearing Restrictions No     Balance Screen   Has the patient fallen in the past 6 months Yes  twisted it and fell suddenly    How many times? 1  with dog, landed on Rt. hip and Rt. foot    Has the patient had a decrease in activity  level because of a fear of falling?  Yes   Is the patient reluctant to leave their home because of a fear of falling?  No     Home Ecologist residence   Living Arrangements Spouse/significant other   Home Access Stairs to enter   Entrance Stairs-Number of Steps 2   Entrance Stairs-Rails None   Home Layout Two level   Alternate Level Stairs-Number of Steps 12   Alternate Level Stairs-Rails Right   Home Equipment [place] - single point   Additional Comments used cane with the fracture      Prior Function   Level of Independence Independent   Vocation Part time employment   Vocation Requirements lifting, standing, carrying    Leisure garden, Community education officer   Overall Cognitive Status Within Functional Limits for tasks assessed     Observation/Other Assessments   Focus on Therapeutic Outcomes (FOTO)  38%     Circumferential Edema   Circumferential - Right below lateral malleolus  9.75 inch   above 8.25 inch    Circumferential -  Left  9.25 inch   8.25 inch      Sensation   Light Touch Appears Intact     Functional Tests   Functional tests Step up;Single leg stance     Step Up   Comments compensates at hip      Single Leg Stance   Comments <10 sec each leg      Posture/Postural Control   Posture/Postural Control Postural limitations   Postural Limitations Flexed trunk   Posture Comments arch preserved in standing      AROM   Right Ankle Dorsiflexion 10   Right Ankle Plantar Flexion 50   Right Ankle Inversion 32   Right Ankle Eversion 40   Left Ankle Dorsiflexion 13   Left Ankle Plantar Flexion 60   Left Ankle Inversion 50   Left Ankle Eversion 40     Strength   Right Hip Flexion 4/5   Right Hip Extension 4/5   Right Hip ABduction 4/5  glute 3+/5    Left Hip Flexion 4/5   Left Hip Extension 4/5   Left Hip ABduction 4/5  glute med 4-/5   Right Knee Flexion 4+/5   Right Knee Extension 4+/5   Left Knee Flexion 4+/5   Left Knee Extension 4+/5   Right Ankle Dorsiflexion 4+/5   Right Ankle Inversion 4-/5   Right Ankle Eversion 4-/5     Palpation   Palpation comment TTP 5th metatarsal head and dorsum of foot   pocket of swelling distal to lateral malleolus      Ambulation/Gait   Gait Comments antalgic, limp to favor Rt. LE , slow pace                   [facility] Adult PT Treatment/Exercise - 06/30/16 0948      Ankle Exercises: Standing   Heel Raises 10 reps   Heel Raises Limitations on step      Ankle Exercises: Stretches   Gastroc Stretch 2 reps;30 seconds   Gastroc Stretch Limitations off step for HEP      Ankle Exercises: Seated   Other Seated Ankle Exercises gave patient ankle T band Inversion and Eversion but did not perfomrm due to time                 PT Education - 06/30/16 1132    Education provided Yes  Education Details eval findings, PT, POC, HEP   Person(s) Educated Patient   Methods Explanation;Demonstration;Tactile cues;Handout;Verbal cues    Comprehension Returned demonstration;Verbalized understanding;Need further instruction          PT Short Term Goals - 06/30/16 1149      PT SHORT TERM GOAL #1   Title Pt will be able to correct her gait when cued for posture, heel strike and step length    Time 3   Period Weeks   Status New     PT SHORT TERM GOAL #2   Title Pt will be able to stand on her Rt. LE for 15 sec unsupported for static balance and stability.    Time 3   Period Weeks   Status New     PT SHORT TERM GOAL #3   Title Pt will perform 20 heel raises without UE support and no pain to demo improved balance and strength    Time 3   Period Weeks   Status New           PT Long Term Goals - 06/30/16 1154      PT LONG TERM GOAL #1   Title Pt will be I with more advanced HEP for LE alignment and gait.    Time 6   Period Weeks   Status New     PT LONG TERM GOAL #2   Title Pt will be able to walk without a limp short distances and no pain in foot.    Time 6   Period Weeks   Status New     PT LONG TERM GOAL #3   Title Pt will be able to walk up steps reciprocally without holding rails and improved confidence.     Time 6   Period Weeks   Status New     PT LONG TERM GOAL #4   Title Pt will stand on Rt. LE for min dynamic activity  for 30 sec and no pain increase.    Time 6   Period Weeks   Status New               Plan - 06/30/16 1137    Clinical Impression Statement Patient presents for low complexity eval for Rt. foot pain due to chronic fracture and sprain.  Per radiographs, her fracture is healed.  She doesn't have severe pain but has difficulty leading with her Rt. leg up stairs and does walk with a slight gait.  I think her weak core and hips may play a role in her limp. Minimal swelling noted and AROM is WFL. although not equal in DF and inversion.    Rehab Potential Good   PT Frequency 2x / week   PT Duration 6 weeks   PT Treatment/Interventions Therapeutic activities;Therapeutic  exercise;Taping;Manual techniques;Vasopneumatic Device;Neuromuscular re-education;Ultrasound;Cryotherapy;Gait training;Stair training;Functional mobility training;Iontophoresis '4mg'$ /ml Dexamethasone;Orthotic Fit/Training;Patient/family education;Balance training   PT Next Visit Plan check HEP, give body mechanics info for when she lifts her equipment, try tape, mobs to increase DF ant ankle , begin core and hip (bridge, hip abd)    PT Home Exercise Plan standing PF, calf stretch, green T band EV and INV   Consulted and Agree with Plan of Care Patient      Patient will benefit from skilled therapeutic intervention in order to improve the following deficits and impairments:  Pain, Decreased mobility, Abnormal gait, Difficulty walking, Increased fascial restricitons, Decreased balance, Decreased strength, Increased edema  Visit Diagnosis: Pain in right ankle and joints  of right foot  Other abnormalities of gait and mobility      G-Codes - Jul 20, 2016 1134    Functional Assessment Tool Used (Outpatient Only) FOTO/clinical judgemetnt    Functional Limitation Mobility: Walking and moving around   Mobility: Walking and Moving Around Current Status [phone]) At least 40 percent but less than 60 percent impaired, limited or restricted   Mobility: Walking and Moving Around Goal Status [phone]) At least 20 percent but less than 40 percent impaired, limited or restricted       Problem List Patient Active Problem List   Diagnosis Date Noted  . Lisfranc's sprain, right, initial encounter 05/01/2016  . Pain in right foot 05/01/2016  . 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

## 2016-07-02 ENCOUNTER — Ambulatory Visit: Payer: Medicare Other | Admitting: Physical Therapy

## 2016-07-02 ENCOUNTER — Encounter: Payer: Self-pay | Admitting: Physical Therapy

## 2016-07-02 DIAGNOSIS — R2689 Other abnormalities of gait and mobility: Secondary | ICD-10-CM | POA: Diagnosis not present

## 2016-07-02 DIAGNOSIS — M25571 Pain in right ankle and joints of right foot: Secondary | ICD-10-CM | POA: Diagnosis not present

## 2016-07-02 NOTE — Therapy (Signed)
[place], [place], [address] Phone: [phone]   Fax:  [phone]  Physical Therapy Treatment  Patient Details  Name: Janet Holland MRN: [id number] Date of Birth: [birth year] Referring Provider: Dr. Meridee Score  Encounter Date: 07/02/2016      PT End of Session - 07/02/16 1134    Visit Number 2   Number of Visits 12   Date for PT Re-Evaluation 08/11/16   PT Start Time 0848   PT Stop Time 0930   PT Time Calculation (min) 42 min   Activity Tolerance Patient tolerated treatment well   Behavior During Therapy [facility] for tasks assessed/performed      Past Medical History:  Diagnosis Date  . Hyperlipidemia   . Osteopenia   . Thyroid disease     Past Surgical History:  Procedure Laterality Date  . APPENDECTOMY    . MYOTOMY    . RHINOPLASTY    . TONSILLECTOMY      There were no vitals filed for this visit.      Subjective Assessment - 07/02/16 0855    Subjective She is doing her exercises.     Currently in Pain? No/denies   Pain Location Foot   Pain Orientation Right   Pain Descriptors / Indicators Tightness  Discomfort   Pain Frequency Intermittent   Aggravating Factors  walking twisting   Pain Relieving Factors elevate   Multiple Pain Sites No                         [facility] Adult PT Treatment/Exercise - 07/02/16 0001      Self-Care   Self-Care --  foot anatomy     Manual Therapy   Manual therapy comments Pa glides mid foot,  toe stretches      Ankle Exercises: Stretches   Gastroc Stretch 3 reps;30 seconds   Gastroc Stretch Limitations step   Other Stretch tennis ball stretch 3 minutes     Ankle Exercises: Seated   Heel Raises 10 reps   Other Seated Ankle Exercises toe yoga, 2 positiond.     Ankle Exercises: Standing   SLS 5 X 2-3 seconds,  HEP  HEP   Heel Raises 10 reps  HEP   Toe Raise 10 reps  HEP                PT Education - 07/02/16  1132    Education provided Yes   Education Details HEP,  anatomy   Person(s) Educated Patient   Methods Explanation;Demonstration;Tactile cues;Verbal cues;Handout   Comprehension Verbalized understanding;Returned demonstration          PT Short Term Goals - 06/30/16 1149      PT SHORT TERM GOAL #1   Title Pt will be able to correct her gait when cued for posture, heel strike and step length    Time 3   Period Weeks   Status New     PT SHORT TERM GOAL #2   Title Pt will be able to stand on her Rt. LE for 15 sec unsupported for static balance and stability.    Time 3   Period Weeks   Status New     PT SHORT TERM GOAL #3   Title Pt will perform 20 heel raises without UE support and no pain to demo improved balance and strength    Time 3   Period Weeks   Status New  PT Long Term Goals - 06/30/16 1154      PT LONG TERM GOAL #1   Title Pt will be I with more advanced HEP for LE alignment and gait.    Time 6   Period Weeks   Status New     PT LONG TERM GOAL #2   Title Pt will be able to walk without a limp short distances and no pain in foot.    Time 6   Period Weeks   Status New     PT LONG TERM GOAL #3   Title Pt will be able to walk up steps reciprocally without holding rails and improved confidence.     Time 6   Period Weeks   Status New     PT LONG TERM GOAL #4   Title Pt will stand on Rt. LE for min dynamic activity  for 30 sec and no pain increase.    Time 6   Period Weeks   Status New               Plan - 07/02/16 1135    Clinical Impression Statement Progress toward HEP with standing ankle strength.  SLS 2-3 seconds at the most.  Patient declined the need for modalitied for pain.  Mild soreness .   PT Next Visit Plan check HEP, give body mechanics info for when she lifts her equipment, try tape, mobs to increase DF ant ankle , begin core and hip (bridge, hip abd)    PT Home Exercise Plan standing PF, calf stretch, green T band EV  and INV,  standing ankle strength.    Consulted and Agree with Plan of Care Patient      Patient will benefit from skilled therapeutic intervention in order to improve the following deficits and impairments:  Pain, Decreased mobility, Abnormal gait, Difficulty walking, Increased fascial restricitons, Decreased balance, Decreased strength, Increased edema  Visit Diagnosis: Pain in right ankle and joints of right foot  Other abnormalities of gait and mobility     Problem List Patient Active Problem List   Diagnosis Date Noted  . Lisfranc's sprain, right, initial encounter 05/01/2016  . Pain in right foot 05/01/2016  . 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

## 2016-07-02 NOTE — Patient Instructions (Addendum)
Standing ankle issued from exercise drawer Daily 10 X - 20 X double heel/toe lifts,  Progress to single leg lifts,  Then walking on toes, heels when ready/stronger SLS 5 X 15 second STG,  45 LTG   OK to use brace etc at beach if pain flares.

## 2016-07-07 ENCOUNTER — Ambulatory Visit: Payer: Medicare Other | Admitting: Physical Therapy

## 2016-07-07 DIAGNOSIS — R2689 Other abnormalities of gait and mobility: Secondary | ICD-10-CM

## 2016-07-07 DIAGNOSIS — M25571 Pain in right ankle and joints of right foot: Secondary | ICD-10-CM

## 2016-07-07 NOTE — Therapy (Signed)
[place], [place], [address] Phone: [phone]   Fax:  [phone]  Physical Therapy Treatment  Patient Details  Name: Janet Holland MRN: [id number] Date of Birth: 1950-07-13 Referring Provider: Dr. Meridee Score  Encounter Date: 07/07/2016      PT End of Session - 07/07/16 0901    Visit Number 3   Number of Visits 12   Date for PT Re-Evaluation 08/11/16   PT Start Time 0848   PT Stop Time 0930   PT Time Calculation (min) 42 min   Activity Tolerance Patient tolerated treatment well   Behavior During Therapy [facility] for tasks assessed/performed      Past Medical History:  Diagnosis Date  . Hyperlipidemia   . Osteopenia   . Thyroid disease     Past Surgical History:  Procedure Laterality Date  . APPENDECTOMY    . MYOTOMY    . RHINOPLASTY    . TONSILLECTOMY      There were no vitals filed for this visit.      Subjective Assessment - 07/07/16 0900    Subjective No pain today. [place] at the beach for 2 days and I did my exercises.                [place] [facility] Adult PT Treatment/Exercise - 07/07/16 0906      Knee/Hip Exercises: Stretches   Active Hamstring Stretch 2 reps;30 seconds   ITB Stretch 2 reps;30 seconds     Knee/Hip Exercises: Supine   Bridges Strengthening;Both;3 sets;5 reps   Bridges Limitations toes up x 5 and heels down x 5      Ankle Exercises: Standing   SLS hip abd on foam airex 2 x 15 each side    Heel Raises 10 reps  2 sets 1 with parallel and turnout   Other Standing Ankle Exercises step ups 4 inch, 6 inch leading with each foot had some L hip discomfort                 PT Education - 07/07/16 0929    Education provided Yes   Education Details hip stretching and ther ex    Person(s) Educated Patient   Methods Explanation   Comprehension Verbalized understanding;Returned demonstration          PT Short Term Goals - 07/07/16 0901      PT SHORT TERM  GOAL #1   Title Pt will be able to correct her gait when cued for posture, heel strike and step length    Status On-going     PT SHORT TERM GOAL #2   Title Pt will be able to stand on her Rt. LE for 15 sec unsupported for static balance and stability.    Baseline less than 5 sec    Status On-going     PT SHORT TERM GOAL #3   Title Pt will perform 20 heel raises without UE support and no pain to demo improved balance and strength    Status On-going           PT Long Term Goals - 07/07/16 0906      PT LONG TERM GOAL #1   Title Pt will be I with more advanced HEP for LE alignment and gait.    Status On-going     PT LONG TERM GOAL #2   Title Pt will be able to walk without a limp short distances and no pain in foot.    Status  On-going     PT LONG TERM GOAL #3   Title Pt will be able to walk up steps reciprocally without holding rails and improved confidence.     Status On-going     PT LONG TERM GOAL #4   Title Pt will stand on Rt. LE for min dynamic activity  for 30 sec and no pain increase.    Status On-going               Plan - 07/07/16 0930    Clinical Impression Statement Integrated hip work into foot and ankle strengthening.  Had some difficulty with step ups but mostly in L hip.  No goals met.    PT Next Visit Plan check HEP, give body mechanics info for when she lifts her equipment, try tape, mobs to increase DF ant ankle , begin core and hip (bridge, hip abd)    PT Home Exercise Plan standing PF, calf stretch, green T band EV and INV,  standing ankle strength. , bridge and hamstring/ITB    Consulted and Agree with Plan of Care Patient      Patient will benefit from skilled therapeutic intervention in order to improve the following deficits and impairments:  Pain, Decreased mobility, Abnormal gait, Difficulty walking, Increased fascial restricitons, Decreased balance, Decreased strength, Increased edema  Visit Diagnosis: Pain in right ankle and joints of  right foot  Other abnormalities of gait and mobility     Problem List Patient Active Problem List   Diagnosis Date Noted  . Lisfranc's sprain, right, initial encounter 05/01/2016  . Pain in right foot 05/01/2016  . 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

## 2016-07-07 NOTE — Patient Instructions (Signed)
Hamstring Stretch, Reclined (Strap, Doorframe)    Lengthen bottom leg on floor. Extend top leg along edge of doorframe or press foot up into yoga strap. Hold for __30 sec __ breaths. Repeat __3__ times each leg. THEN CROSS YOUR LEG OVER THE MIDLINE TO FEEL THE STRETCH IN YOUR OUTER THIGH and LOWER LEG  Copyright  VHI. All rights reserved.

## 2016-07-10 ENCOUNTER — Ambulatory Visit: Payer: Medicare Other | Admitting: Physical Therapy

## 2016-07-10 DIAGNOSIS — M25571 Pain in right ankle and joints of right foot: Secondary | ICD-10-CM

## 2016-07-10 DIAGNOSIS — R2689 Other abnormalities of gait and mobility: Secondary | ICD-10-CM

## 2016-07-10 NOTE — Therapy (Signed)
[place], [place], [address] Phone: [phone]   Fax:  [phone]  Physical Therapy Treatment  Patient Details  Name: Janet Holland MRN: [id number] Date of Birth: December 28, 1950 Referring Provider: Dr. Meridee Score  Encounter Date: 07/10/2016      PT End of Session - 07/10/16 0913    Visit Number 4   Number of Visits 12   Date for PT Re-Evaluation 08/11/16   PT Start Time 0849   PT Stop Time 0931   PT Time Calculation (min) 42 min   Activity Tolerance Patient tolerated treatment well   Behavior During Therapy [facility] for tasks assessed/performed      Past Medical History:  Diagnosis Date  . Hyperlipidemia   . Osteopenia   . Thyroid disease     Past Surgical History:  Procedure Laterality Date  . APPENDECTOMY    . MYOTOMY    . RHINOPLASTY    . TONSILLECTOMY      There were no vitals filed for this visit.      Subjective Assessment - 07/10/16 0854    Subjective No pain other than the tightness in my Rt. calf (got a charley horse)    Currently in Pain? No/denies              [facility] Adult PT Treatment/Exercise - 07/10/16 0859      Knee/Hip Exercises: Machines for Strengthening   Other Machine Pilates Reformer     Ankle Exercises: Stretches   Gastroc Stretch 3 reps;30 seconds   Gastroc Stretch Limitations step   Slant Board Stretch 3 reps;30 seconds   Other Stretch stretch bilaterally     Ankle Exercises: Standing   SLS static on foam and tile floor  unable to do >10 sec without UE assist.      Pilates Reformer used for LE/core strength, postural strength, lumbopelvic disassociation and core control.  Exercises included:  Footwork 2 Red 1 Blue heels, arch and forefoot, added heel raises and prancing with cues for pelvic stability.  Needed manual cues for foot positioning.   Bridging 2 Red 1 Blue on heels with yoga block for alignment             PT Education -  07/10/16 0930    Education provided Yes   Education Details single limb stance , Pilates Reformer   Person(s) Educated Patient   Methods Explanation;Handout;Verbal cues   Comprehension Verbalized understanding;Returned demonstration          PT Short Term Goals - 07/07/16 0901      PT SHORT TERM GOAL #1   Title Pt will be able to correct her gait when cued for posture, heel strike and step length    Status On-going     PT SHORT TERM GOAL #2   Title Pt will be able to stand on her Rt. LE for 15 sec unsupported for static balance and stability.    Baseline less than 5 sec    Status On-going     PT SHORT TERM GOAL #3   Title Pt will perform 20 heel raises without UE support and no pain to demo improved balance and strength    Status On-going           PT Long Term Goals - 07/07/16 0906      PT LONG TERM GOAL #1   Title Pt will be I with more advanced HEP for LE alignment and gait.    Status On-going  PT LONG TERM GOAL #2   Title Pt will be able to walk without a limp short distances and no pain in foot.    Status On-going     PT LONG TERM GOAL #3   Title Pt will be able to walk up steps reciprocally without holding rails and improved confidence.     Status On-going     PT LONG TERM GOAL #4   Title Pt will stand on Rt. LE for min dynamic activity  for 30 sec and no pain increase.    Status On-going               Plan - 07/10/16 0931    Clinical Impression Statement Patient without pain, used Reformer for articulation of ankle and metastarsals, core control.  Addressed SLS with simple HEP.    PT Next Visit Plan check HEP, give body mechanics info for when she lifts her equipment, try tape, mobs to increase DF ant ankle , begin core and hip (bridge, hip abd)    PT Home Exercise Plan standing PF, calf stretch, green T band EV and INV,  standing ankle strength. , bridge and hamstring/ITB , SLS   Consulted and Agree with Plan of Care Patient      Patient  will benefit from skilled therapeutic intervention in order to improve the following deficits and impairments:  Pain, Decreased mobility, Abnormal gait, Difficulty walking, Increased fascial restricitons, Decreased balance, Decreased strength, Increased edema  Visit Diagnosis: Pain in right ankle and joints of right foot  Other abnormalities of gait and mobility     Problem List Patient Active Problem List   Diagnosis Date Noted  . Lisfranc's sprain, right, initial encounter 05/01/2016  . Pain in right foot 05/01/2016  . 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

## 2016-07-10 NOTE — Patient Instructions (Signed)
SINGLE LIMB STANCE    Stance: single leg on floor. Raise leg. Hold __work on up to 30 sec _ seconds. Repeat with other leg. _3-5__ reps per set, _1-2__ sets per day, _3-5__ days per week  Copyright  VHI. All rights reserved.

## 2016-07-14 ENCOUNTER — Ambulatory Visit: Payer: Medicare Other | Admitting: Physical Therapy

## 2016-07-14 ENCOUNTER — Encounter: Payer: Self-pay | Admitting: Physical Therapy

## 2016-07-14 DIAGNOSIS — R2689 Other abnormalities of gait and mobility: Secondary | ICD-10-CM | POA: Diagnosis not present

## 2016-07-14 DIAGNOSIS — M25571 Pain in right ankle and joints of right foot: Secondary | ICD-10-CM | POA: Diagnosis not present

## 2016-07-14 NOTE — Therapy (Signed)
[facility] [address] [place], [place], [address] Phone: [phone]   Fax:  [phone]  Physical Therapy Treatment  Patient Details  Name: Janet Holland MRN: [id number] Date of Birth: 1950/11/01 Referring Provider: Dr. Aldean Baker  Encounter Date: 07/14/2016      PT End of Session - 07/14/16 1345    Visit Number 5   Number of Visits 12   Date for PT Re-Evaluation 08/11/16   PT Start Time 0849   PT Stop Time 0930   PT Time Calculation (min) 41 min   Activity Tolerance Patient tolerated treatment well   Behavior During Therapy [facility] for tasks assessed/performed      Past Medical History:  Diagnosis Date  . Hyperlipidemia   . Osteopenia   . Thyroid disease     Past Surgical History:  Procedure Laterality Date  . APPENDECTOMY    . MYOTOMY    . RHINOPLASTY    . TONSILLECTOMY      There were no vitals filed for this visit.      Subjective Assessment - 07/14/16 0903    Subjective Able to walk 1/2 to 3/4 mile ( time limits vs pain)  Stretch on step 4/10 pain.  minimal pain.  limps less.   Currently in Pain? Yes   Pain Score 1    Pain Descriptors / Indicators --  twinge   Pain Type Chronic pain   Aggravating Factors  end of day,  first am   Pain Relieving Factors elevate   Multiple Pain Sites --  Lt hip pain worse sleeping on,  steps,  joint pain ,  better with change of position at night.  3/10 sidelying                         [facility] Adult PT Treatment/Exercise - 07/14/16 0001      Self-Care   Self-Care Lifting  Chart used to educate re: hips and foot position   Lifting 8 and 38 LBS.  simulating work tasks,  minor cues to make multiple small steps vs twistinf to swing objects with momentum.    Other Self-Care Comments  tennis balls issued.     Therapeutic Activites    Therapeutic Activities Lifting;Work Catering manager 38 LBS.   Work Counselling psychologist getting things in/ out car      Knee/Hip Exercises: Programme researcher, broadcasting/film/video 2 reps;30 seconds   ITB Stretch 3 reps;20 seconds;30 seconds  sidelying and standing   Piriformis Stretch 3 reps;30 seconds   Piriformis Stretch Limitations 2 positions     Knee/Hip Exercises: Supine   Pension scheme manager Limitations flat, heel ,toes 10 X     Knee/Hip Exercises: Sidelying   Clams 10 X each,  starting top leg positioned Longer,  Monitored for compensation     Ankle Exercises: Stretches   Gastroc Stretch Limitations painful at home,  encouraged to continue however may stretch with foot on floor "Runner's stretch"                PT Education - 07/14/16 1344    Education provided Yes   Education Details anatomy   Person(s) Educated Patient   Methods Explanation;Demonstration   Comprehension Verbalized understanding          PT Short Term Goals - 07/14/16 1345      PT SHORT TERM GOAL #1   Title Pt will be able to correct her  gait when cued for posture, heel strike and step length    Baseline able to demo in clinic   Time 3   Period Weeks   Status Partially Met     PT SHORT TERM GOAL #2   Title Pt will be able to stand on her Rt. LE for 15 sec unsupported for static balance and stability.    Time 3   Period Weeks   Status Unable to assess     PT SHORT TERM GOAL #3   Title Pt will perform 20 heel raises without UE support and no pain to demo improved balance and strength    Baseline painful   Time 3   Period Weeks   Status On-going           PT Long Term Goals - 07/07/16 0906      PT LONG TERM GOAL #1   Title Pt will be I with more advanced HEP for LE alignment and gait.    Status On-going     PT LONG TERM GOAL #2   Title Pt will be able to walk without a limp short distances and no pain in foot.    Status On-going     PT LONG TERM GOAL #3   Title Pt will be able to walk up steps reciprocally without holding rails and improved confidence.     Status On-going      PT LONG TERM GOAL #4   Title Pt will stand on Rt. LE for min dynamic activity  for 30 sec and no pain increase.    Status On-going               Plan - 07/14/16 1346    Clinical Impression Statement STG#1 partially met.  Patient able to walk the dog 45 minutes.  Not limited by pain.  Focus on hip strenght/ stretches today. Minor cues given for Lifting. 38 LBS.   PT Next Visit Plan check HEP, give body mechanics info for when she lifts her equipment (practiced, did not issue), try tape, mobs to increase DF ant ankle , begin core and hip (bridge, hip abd)    PT Home Exercise Plan standing PF, calf stretch, green T band EV and INV,  standing ankle strength. , bridge and hamstring/ITB , SLS   Consulted and Agree with Plan of Care Patient      Patient will benefit from skilled therapeutic intervention in order to improve the following deficits and impairments:  Pain, Decreased mobility, Abnormal gait, Difficulty walking, Increased fascial restricitons, Decreased balance, Decreased strength, Increased edema  Visit Diagnosis: Pain in right ankle and joints of right foot  Other abnormalities of gait and mobility     Problem List Patient Active Problem List   Diagnosis Date Noted  . Lisfranc's sprain, right, initial encounter 05/01/2016  . Pain in right foot 05/01/2016  . 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 [place], [place], [address] Phone: [phone]   Fax:  [phone]  Name: Janet Holland MRN: [id number] Date of Birth: 12/30/1950

## 2016-07-17 ENCOUNTER — Ambulatory Visit: Payer: Medicare Other | Admitting: Physical Therapy

## 2016-07-17 DIAGNOSIS — R2689 Other abnormalities of gait and mobility: Secondary | ICD-10-CM | POA: Diagnosis not present

## 2016-07-17 DIAGNOSIS — M25571 Pain in right ankle and joints of right foot: Secondary | ICD-10-CM | POA: Diagnosis not present

## 2016-07-17 NOTE — Therapy (Signed)
[place], [place], [address] Phone: [phone]   Fax:  [phone]  Physical Therapy Treatment  Patient Details  Name: Janet Holland MRN: [id number] Date of Birth: [birth year] Referring Provider: Dr. Meridee Score  Encounter Date: 07/17/2016      PT End of Session - 07/17/16 0854    Visit Number 6   Number of Visits 12   Date for PT Re-Evaluation 08/11/16   PT Start Time 0810   PT Stop Time 0850   PT Time Calculation (min) 40 min   Activity Tolerance Patient tolerated treatment well   Behavior During Therapy [facility] for tasks assessed/performed      Past Medical History:  Diagnosis Date  . Hyperlipidemia   . Osteopenia   . Thyroid disease     Past Surgical History:  Procedure Laterality Date  . APPENDECTOMY    . MYOTOMY    . RHINOPLASTY    . TONSILLECTOMY      There were no vitals filed for this visit.      Subjective Assessment - 07/17/16 0814    Subjective Therapy is helping.  Outside of my foot feels tight.  Used tennis ball 10-15 min last night.    Currently in Pain? Yes   Pain Score 1    Pain Location Foot   Pain Orientation Right   Pain Descriptors / Indicators Discomfort   Pain Type Chronic pain   Pain Onset More than a month ago   Pain Frequency Intermittent                         [facility] Adult PT Treatment/Exercise - 07/17/16 0817      Knee/Hip Exercises: Stretches   Quad Stretch Both;1 rep;30 seconds     Knee/Hip Exercises: Aerobic   Elliptical level 1 resistance , level 3 ramp for 5 min, fatigued      Knee/Hip Exercises: Standing   Forward Lunges Both;1 set;10 reps;Other (comment)   Forward Lunges Limitations 10 sec hold foot on BOSU    Hip Abduction Stengthening;Both;1 set;15 reps   Hip Extension Stengthening;Both;1 set;15 reps     Manual Therapy   Manual Therapy Joint mobilization;Soft tissue mobilization   Manual therapy comments PA glides mid  foot,  toe stretches    Joint Mobilization subtalar and metartarsals    Soft tissue mobilization plantar fascia      Ankle Exercises: Stretches   Soleus Stretch 2 reps;30 seconds   Gastroc Stretch 2 reps;30 seconds     Ankle Exercises: Standing   Heel Raises 20 reps  off step for added stretch                 PT Education - 07/17/16 0854    Education provided No          PT Short Term Goals - 07/14/16 1345      PT SHORT TERM GOAL #1   Title Pt will be able to correct her gait when cued for posture, heel strike and step length    Baseline able to demo in clinic   Time 3   Period Weeks   Status Partially Met     PT SHORT TERM GOAL #2   Title Pt will be able to stand on her Rt. LE for 15 sec unsupported for static balance and stability.    Time 3   Period Weeks   Status Unable to assess  PT SHORT TERM GOAL #3   Title Pt will perform 20 heel raises without UE support and no pain to demo improved balance and strength    Baseline painful   Time 3   Period Weeks   Status On-going           PT Long Term Goals - 07/07/16 0906      PT LONG TERM GOAL #1   Title Pt will be I with more advanced HEP for LE alignment and gait.    Status On-going     PT LONG TERM GOAL #2   Title Pt will be able to walk without a limp short distances and no pain in foot.    Status On-going     PT LONG TERM GOAL #3   Title Pt will be able to walk up steps reciprocally without holding rails and improved confidence.     Status On-going     PT LONG TERM GOAL #4   Title Pt will stand on Rt. LE for min dynamic activity  for 30 sec and no pain increase.    Status On-going               Plan - 07/17/16 6789    Clinical Impression Statement No complaints today during exercises.  Rt. leg showed less stability with balance exercises, knows her stretches.    PT Next Visit Plan check HEP, give body mechanics info for when she lifts her equipment (practiced, did not issue),  try tape, mobs to increase DF ant ankle , begin core and hip (bridge, hip abd)    PT Home Exercise Plan standing PF, calf stretch, green T band EV and INV,  standing ankle strength. , bridge and hamstring/ITB , SLS   Consulted and Agree with Plan of Care Patient      Patient will benefit from skilled therapeutic intervention in order to improve the following deficits and impairments:  Pain, Decreased mobility, Abnormal gait, Difficulty walking, Increased fascial restricitons, Decreased balance, Decreased strength, Increased edema  Visit Diagnosis: Pain in right ankle and joints of right foot  Other abnormalities of gait and mobility     Problem List Patient Active Problem List   Diagnosis Date Noted  . Lisfranc's sprain, right, initial encounter 05/01/2016  . Pain in right foot 05/01/2016  . 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 [place], [place], [address] Phone: [phone]   Fax:  [phone]  Name: MARICIA SCOTTI MRN: [id number] Date of Birth: 1951/03/26  Raeford Razor, PT 07/17/16 8:58 AM Phone: [phone] Fax: [phone]

## 2016-07-21 ENCOUNTER — Ambulatory Visit: Payer: Medicare Other | Admitting: Physical Therapy

## 2016-07-21 ENCOUNTER — Encounter: Payer: Self-pay | Admitting: Physical Therapy

## 2016-07-21 DIAGNOSIS — M25571 Pain in right ankle and joints of right foot: Secondary | ICD-10-CM | POA: Diagnosis not present

## 2016-07-21 DIAGNOSIS — R2689 Other abnormalities of gait and mobility: Secondary | ICD-10-CM

## 2016-07-21 NOTE — Therapy (Signed)
[place], [place], [address] Phone: [phone]   Fax:  [phone]  Physical Therapy Treatment  Patient Details  Name: Janet Holland MRN: [id number] Date of Birth: 1950/08/12 Referring Provider: Dr. Meridee Score  Encounter Date: 07/21/2016      PT End of Session - 07/21/16 2136    Visit Number 7   Number of Visits 12   Date for PT Re-Evaluation 08/11/16   PT Start Time 1416   PT Stop Time 1500   PT Time Calculation (min) 44 min   Activity Tolerance Patient tolerated treatment well   Behavior During Therapy [facility] for tasks assessed/performed      Past Medical History:  Diagnosis Date  . Hyperlipidemia   . Osteopenia   . Thyroid disease     Past Surgical History:  Procedure Laterality Date  . APPENDECTOMY    . MYOTOMY    . RHINOPLASTY    . TONSILLECTOMY      There were no vitals filed for this visit.      Subjective Assessment - 07/21/16 1420    Subjective Discomfort on the top of my foot.  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    Currently in Pain? Yes   Pain Score 1    Pain Location Foot   Pain Orientation Right   Pain Descriptors / Indicators Discomfort   Pain Type Chronic pain   Pain Onset More than a month ago   Pain Frequency Intermittent            [facility] Adult PT Treatment/Exercise - 07/21/16 1432      Knee/Hip Exercises: Standing   Other Standing Knee Exercises hip hinge in parallel bars for balance and hip ext      Knee/Hip Exercises: Seated   Sit to Sand 3 sets;10 reps;without UE support  used ball between knees and then ankles to faciliate alignme     Manual Therapy   Joint Mobilization subtalar and metartarsals    Soft tissue mobilization plantar fascia      Ankle Exercises: Stretches   Plantar Fascia Stretch 3 reps;30 seconds     Ankle Exercises: Standing   Rebounder Rt. Leg SLS with light purple ball and weighted green  ball.  Pt had difficulty doing this bilaterally.  No LOB    Heel Raises 20 reps  off step for added stretch    Other Standing Ankle Exercises rebounder in tandem stance multiple trials                 PT Education - 07/21/16 1422    Education provided Yes   Education Details stretching, using a braced core when lifting her equipment, body mechanics    Person(s) Educated Patient   Methods Explanation;Handout   Comprehension Verbalized understanding;Returned demonstration          PT Short Term Goals - 07/21/16 2139      PT SHORT TERM GOAL #1   Title Pt will be able to correct her gait when cued for posture, heel strike and step length    Status Achieved     PT SHORT TERM GOAL #2   Title Pt will be able to stand on her Rt. LE for 15 sec unsupported for static balance and stability.    Status On-going     PT SHORT TERM GOAL #3   Title Pt will perform 20 heel raises without UE support and  no pain to demo improved balance and strength    Status On-going           PT Long Term Goals - 07/21/16 2139      PT LONG TERM GOAL #1   Title Pt will be I with more advanced HEP for LE alignment and gait.    Status On-going     PT LONG TERM GOAL #2   Title Pt will be able to walk without a limp short distances and no pain in foot.    Status Unable to assess     PT LONG TERM GOAL #3   Title Pt will be able to walk up steps reciprocally without holding rails and improved confidence.     Status On-going     PT LONG TERM GOAL #4   Title Pt will stand on Rt. LE for min dynamic activity  for 30 sec and no pain increase.    Status On-going               Plan - 07/21/16 2136    Clinical Impression Statement Pt lacks dynamic balance bilateral LEs, unable to catch/toss ball consecutively while standing on tile floor without putting other foot down.  She was unable perform SLS static on foam without UE assist.  Despite that, she has min discomfort. Knows how to stretch  including rolling to the bottom of her foot.    PT Next Visit Plan check HEP, , try tape, mobs to increase DF ant ankle , begin core and hip (bridge, hip abd) single leg balance, try Reformer for single leg footwork and even in sidelying for hip with PF   PT Home Exercise Plan standing PF, calf stretch, green T band EV and INV,  standing ankle strength. , bridge and hamstring/ITB , SLS   Consulted and Agree with Plan of Care Patient      Patient will benefit from skilled therapeutic intervention in order to improve the following deficits and impairments:  Pain, Decreased mobility, Abnormal gait, Difficulty walking, Increased fascial restricitons, Decreased balance, Decreased strength, Increased edema  Visit Diagnosis: Pain in right ankle and joints of right foot  Other abnormalities of gait and mobility     Problem List Patient Active Problem List   Diagnosis Date Noted  . Lisfranc's sprain, right, initial encounter 05/01/2016  . Pain in right foot 05/01/2016  . 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

## 2016-07-24 ENCOUNTER — Ambulatory Visit: Payer: Medicare Other | Admitting: Physical Therapy

## 2016-07-24 DIAGNOSIS — R2689 Other abnormalities of gait and mobility: Secondary | ICD-10-CM

## 2016-07-24 DIAGNOSIS — M25571 Pain in right ankle and joints of right foot: Secondary | ICD-10-CM

## 2016-07-24 NOTE — Therapy (Signed)
[place], [place], [address] Phone: [phone]   Fax:  [phone]  Physical Therapy Treatment  Patient Details  Name: Janet Holland MRN: [id number] Date of Birth: 05-09-1950 Referring Provider: Dr. Meridee Score  Encounter Date: 07/24/2016      PT End of Session - 07/24/16 0927    Visit Number 8   Number of Visits 12   Date for PT Re-Evaluation 08/11/16   PT Start Time 0848   PT Stop Time 0930   PT Time Calculation (min) 42 min   Activity Tolerance Patient tolerated treatment well   Behavior During Therapy [facility] for tasks assessed/performed      Past Medical History:  Diagnosis Date  . Hyperlipidemia   . Osteopenia   . Thyroid disease     Past Surgical History:  Procedure Laterality Date  . APPENDECTOMY    . MYOTOMY    . RHINOPLASTY    . TONSILLECTOMY      There were no vitals filed for this visit.      Subjective Assessment - 07/24/16 0848    Subjective Tightness today, swelling is improving.  I even ran a bit last night.     Currently in Pain? No/denies           [facility] Adult PT Treatment/Exercise - 07/24/16 0853      Knee/Hip Exercises: Aerobic   Recumbent Bike level 2, 6 min, intervals     Knee/Hip Exercises: Machines for Strengthening   Other Machine Pilates Reformer 2 Red 1 blue seenote        Pilates Reformer used for LE/core strength, postural strength, lumbopelvic disassociation and core control.  Exercises included:  Footwork heels, arch and forefoot, used ball between knees for alignment and better midline activation.   Heel raises, calf stretch and prancing for dynamic stretching  Performed heel raises in parallel, turn out and turned in.    Single leg sidelying press out for lateral hip and peroneals 2 [place]         PT Short Term Goals - 07/21/16 2139      PT SHORT TERM GOAL #1   Title Pt will be able to correct her gait when cued for  posture, heel strike and step length    Status Achieved     PT SHORT TERM GOAL #2   Title Pt will be able to stand on her Rt. LE for 15 sec unsupported for static balance and stability.    Status On-going     PT SHORT TERM GOAL #3   Title Pt will perform 20 heel raises without UE support and no pain to demo improved balance and strength    Status On-going           PT Long Term Goals - 07/21/16 2139      PT LONG TERM GOAL #1   Title Pt will be I with more advanced HEP for LE alignment and gait.    Status On-going     PT LONG TERM GOAL #2   Title Pt will be able to walk without a limp short distances and no pain in foot.    Status Unable to assess     PT LONG TERM GOAL #3   Title Pt will be able to walk up steps reciprocally without holding rails and improved confidence.     Status On-going     PT LONG TERM GOAL #4   Title Pt will stand on  Rt. LE for min dynamic activity  for 30 sec and no pain increase.    Status On-going               Plan - 07/24/16 2023    Clinical Impression Statement Worked on PIlates Reformer for LE alignment and hip/core/ankle strengthening.  Needs cues to increase PF and core support.    PT Next Visit Plan check HEP, , try tape, mobs to increase DF ant ankle , begin core and hip (bridge, hip abd) single leg balance, try Reformer for single leg footwork and even in sidelying for hip with PF   PT Home Exercise Plan standing PF, calf stretch, green T band EV and INV,  standing ankle strength. , bridge and hamstring/ITB , SLS   Consulted and Agree with Plan of Care Patient      Patient will benefit from skilled therapeutic intervention in order to improve the following deficits and impairments:  Pain, Decreased mobility, Abnormal gait, Difficulty walking, Increased fascial restricitons, Decreased balance, Decreased strength, Increased edema  Visit Diagnosis: Pain in right ankle and joints of right foot  Other abnormalities of gait and  mobility     Problem List Patient Active Problem List   Diagnosis Date Noted  . Lisfranc's sprain, right, initial encounter 05/01/2016  . Pain in right foot 05/01/2016  . 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

## 2016-07-28 ENCOUNTER — Encounter: Payer: Self-pay | Admitting: Physical Therapy

## 2016-07-28 ENCOUNTER — Ambulatory Visit: Payer: Medicare Other | Attending: Family | Admitting: Physical Therapy

## 2016-07-28 DIAGNOSIS — M25571 Pain in right ankle and joints of right foot: Secondary | ICD-10-CM | POA: Insufficient documentation

## 2016-07-28 DIAGNOSIS — R2689 Other abnormalities of gait and mobility: Secondary | ICD-10-CM | POA: Diagnosis not present

## 2016-07-28 NOTE — Therapy (Signed)
[place], [place], [address] Phone: [phone]   Fax:  [phone]  Physical Therapy Treatment  Patient Details  Name: Janet Holland MRN: [id number] Date of Birth: 1951-03-16 Referring Provider: Dr. Meridee Score  Encounter Date: 07/28/2016      PT End of Session - 07/28/16 1500    Visit Number 9   Number of Visits 12   Date for PT Re-Evaluation 08/11/16   PT Start Time 1418   PT Stop Time 1500   PT Time Calculation (min) 42 min   Activity Tolerance Patient tolerated treatment well   Behavior During Therapy [facility] for tasks assessed/performed      Past Medical History:  Diagnosis Date  . Hyperlipidemia   . Osteopenia   . Thyroid disease     Past Surgical History:  Procedure Laterality Date  . APPENDECTOMY    . MYOTOMY    . RHINOPLASTY    . TONSILLECTOMY      There were no vitals filed for this visit.      Subjective Assessment - 07/28/16 1420    Subjective I think its doing well.  Was able to do heavy yardwork and lifting for my raised bed.  no pain today.     Currently in Pain? No/denies            [facility] PT Assessment - 07/28/16 1622      Strength   Right Hip ABduction 3+/5   Left Hip ABduction 4-/5             [facility] Adult PT Treatment/Exercise - 07/28/16 1429      Knee/Hip Exercises: Stretches   ITB Stretch 2 reps;30 seconds   Piriformis Stretch 2 reps;30 seconds     Knee/Hip Exercises: Standing   Heel Raises Both;1 set;20 reps   Hip Abduction Stengthening;Both;1 set;10 reps   Forward Step Up Both;1 set;20 reps;Hand Hold: 1;Step Height: 6"   Step Down Both;1 set;10 reps;Hand Hold: 1;Step Height: 4"   SLS static (less than 8 sec)   SLS with Vectors x 10 hip ext and abd    Other Standing Knee Exercises glute med drop off step x 12     Knee/Hip Exercises: Sidelying   Hip ABduction Strengthening;Both;1 set;10 reps   Clams x 20 each      Used mirror for visual  feedback of LE alignment            PT Education - 07/28/16 1623    Education provided Yes   Education Details hip weakness and compensation strategies in standng , HEP    Person(s) Educated Patient   Methods Explanation;Demonstration;Verbal cues;Handout   Comprehension Returned demonstration;Verbalized understanding;Need further instruction          PT Short Term Goals - 07/28/16 1423      PT SHORT TERM GOAL #1   Title Pt will be able to correct her gait when cued for posture, heel strike and step length    Status Achieved     PT SHORT TERM GOAL #2   Title Pt will be able to stand on her Rt. LE for 15 sec unsupported for static balance and stability.    Baseline 5 sec    Status On-going     PT SHORT TERM GOAL #3   Title Pt will perform 20 heel raises without UE support and no pain to demo improved balance and strength    Baseline poor balance and control    Status  Partially Met           PT Long Term Goals - 07/28/16 1426      PT LONG TERM GOAL #1   Title Pt will be I with more advanced HEP for LE alignment and gait.    Status On-going     PT LONG TERM GOAL #2   Title Pt will be able to walk without a limp short distances and no pain in foot.    Status Achieved     PT LONG TERM GOAL #3   Title Pt will be able to walk up steps reciprocally without holding rails and improved confidence.     Baseline pain in L leg, hip with ascending steps    Status Partially Met     PT LONG TERM GOAL #4   Title Pt will stand on Rt. LE for min dynamic activity  for 30 sec and no pain increase.    Status On-going               Plan - 07/28/16 1623    Clinical Impression Statement No pain today but did cause pain with step ups in LLE.  Worked on balancing hips in standing, SLS impaired to less than 8 sec on each leg.  Rt. hip is tighter, weaker in abdution but Lt side more painful .  Continuing to address hip and core and ankle strength as it pertains to gait and  balance.    PT Next Visit Plan FOTO, check HEP,  begin core and hip (bridge, hip abd) single leg balance, try Reformer for single leg footwork and even in sidelying for hip with PF   PT Home Exercise Plan standing PF, calf stretch, green T band EV and INV,  standing ankle strength. , bridge and hamstring/ITB , SLS, clam, hip abduction   Consulted and Agree with Plan of Care Patient      Patient will benefit from skilled therapeutic intervention in order to improve the following deficits and impairments:  Pain, Decreased mobility, Abnormal gait, Difficulty walking, Increased fascial restricitons, Decreased balance, Decreased strength, Increased edema  Visit Diagnosis: Pain in right ankle and joints of right foot  Other abnormalities of gait and mobility     Problem List Patient Active Problem List   Diagnosis Date Noted  . Lisfranc's sprain, right, initial encounter 05/01/2016  . Pain in right foot 05/01/2016  . 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

## 2016-07-28 NOTE — Patient Instructions (Signed)
Outer Hip Stretch: Figure Four (Chair)    Adjust bend of supporting leg for less deep stretch. Hold for __3__ breaths. Repeat _3___ times each leg.  Copyright  VHI. All rights reserved.  Can also do this on your back. Pull thigh to your face:)  Single knee to chest add rotation to Rt.    Abduction: Clam (Eccentric) - Side-Lying    Lie on side with knees bent. Lift top knee, keeping feet together. Keep trunk steady. Slowly lower for 3-5 seconds. _20__ reps per set, _1 sets per day, __5_ days per week. http://ecce.exer.us/65   Copyright  VHI. All rights reserved.   Abduction: Side Leg Lift (Eccentric) - Side-Lying    Lie on side. Lift top leg slightly higher than shoulder level. Keep top leg straight with body, toes pointing forward. Slowly lower for 3-5 seconds. ___ reps per set, ___ sets per day, ___ days per week. Add ___ lbs when you achieve ___ repetitions.  http://ecce.exer.us/63   Copyright  VHI. All rights reserved.    Single Leg (Compliant Surface) - Eyes Open    Stand on compliant surface: ________ holding support. Lift right leg while maintaining balance over other leg. Progress to removing hands from support surface for longer periods of time. Hold____ seconds. Repeat ____ times per session. Do ____ sessions per day.  Copyright  VHI. All rights reserved.

## 2016-07-31 ENCOUNTER — Ambulatory Visit: Payer: Medicare Other | Admitting: Physical Therapy

## 2016-07-31 DIAGNOSIS — R2689 Other abnormalities of gait and mobility: Secondary | ICD-10-CM | POA: Diagnosis not present

## 2016-07-31 DIAGNOSIS — M25571 Pain in right ankle and joints of right foot: Secondary | ICD-10-CM

## 2016-07-31 NOTE — Therapy (Signed)
[place], [place], [address] Phone: [phone]   Fax:  [phone]  Physical Therapy Treatment  Patient Details  Name: Janet Holland MRN: [id number] Date of Birth: 10-07-1950 Referring Provider: Dr. Meridee Score  Encounter Date: 07/31/2016      PT End of Session - 07/31/16 0903    Visit Number 10   Number of Visits 12   Date for PT Re-Evaluation 08/11/16   PT Start Time 0848   PT Stop Time 0932   PT Time Calculation (min) 44 min   Activity Tolerance Patient tolerated treatment well   Behavior During Therapy [facility] for tasks assessed/performed      Past Medical History:  Diagnosis Date  . Hyperlipidemia   . Osteopenia   . Thyroid disease     Past Surgical History:  Procedure Laterality Date  . APPENDECTOMY    . MYOTOMY    . RHINOPLASTY    . TONSILLECTOMY      There were no vitals filed for this visit.      Subjective Assessment - 07/31/16 0853    Subjective My foot was hurting maybe its because I was jogging.  Had some hip pain last night in the bed, but not as bad as it has been.    Currently in Pain? No/denies            [facility] Adult PT Treatment/Exercise - 07/31/16 0900      Knee/Hip Exercises: Aerobic   Nustep L6 LEs only for 6 min     Knee/Hip Exercises: Standing   Heel Raises Both;3 sets;20 reps   Heel Raises Limitations toes in, out and parallel    SLS with Vectors Green band 4 way SLR x 10 with min UE A      Manual Therapy   Soft tissue mobilization 5th met dorsal surface and plantar fascia , forefoot      Ankle Exercises: Stretches   Slant Board Stretch 5 reps;30 seconds   Slant Board Stretch Limitations gastrocsoleus   Other Stretch towel stretch for supination and pronation     Ankle Exercises: Seated   Towel Crunch 5 reps                PT Education - 07/31/16 0909    Education provided No          PT Short Term Goals - 07/28/16 1423      PT SHORT TERM GOAL #1   Title Pt will be able to correct her gait when cued for posture, heel strike and step length    Status Achieved     PT SHORT TERM GOAL #2   Title Pt will be able to stand on her Rt. LE for 15 sec unsupported for static balance and stability.    Baseline 5 sec    Status On-going     PT SHORT TERM GOAL #3   Title Pt will perform 20 heel raises without UE support and no pain to demo improved balance and strength    Baseline poor balance and control    Status Partially Met           PT Long Term Goals - 07/28/16 1426      PT LONG TERM GOAL #1   Title Pt will be I with more advanced HEP for LE alignment and gait.    Status On-going     PT LONG TERM GOAL #2   Title Pt will be able to  walk without a limp short distances and no pain in foot.    Status Achieved     PT LONG TERM GOAL #3   Title Pt will be able to walk up steps reciprocally without holding rails and improved confidence.     Baseline pain in L leg, hip with ascending steps    Status Partially Met     PT LONG TERM GOAL #4   Title Pt will stand on Rt. LE for min dynamic activity  for 30 sec and no pain increase.    Status On-going               Plan - 08-14-16 0909    Clinical Impression Statement Patient doing well, able to short bouts (15-30 sec) during her walks with the dog.  Pain is improving and diminishing. Improved about 6% on FOTO , at predicted level of progress.    PT Next Visit Plan  check HEP,  begin core and hip (bridge, hip abd) single leg balance, try Reformer for single leg footwork and even in sidelying for hip with PF   PT Home Exercise Plan standing PF, calf stretch, green T band EV and INV,  standing ankle strength. , bridge and hamstring/ITB , SLS, clam, hip abduction   Consulted and Agree with Plan of Care Patient      Patient will benefit from skilled therapeutic intervention in order to improve the following deficits and impairments:  Pain, Decreased mobility,  Abnormal gait, Difficulty walking, Increased fascial restricitons, Decreased balance, Decreased strength, Increased edema  Visit Diagnosis: Pain in right ankle and joints of right foot  Other abnormalities of gait and mobility       G-Codes - 2016-08-14 1223    Functional Assessment Tool Used (Outpatient Only) FOTO/clinical judgemetnt    Functional Limitation Mobility: Walking and moving around   Mobility: Walking and Moving Around Current Status [phone]) At least 20 percent but less than 40 percent impaired, limited or restricted   Mobility: Walking and Moving Around Goal Status [id number]) At least 20 percent but less than 40 percent impaired, limited or restricted      Problem List Patient Active Problem List   Diagnosis Date Noted  . Lisfranc's sprain, right, initial encounter 05/01/2016  . Pain in right foot 05/01/2016  . 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

## 2016-08-03 ENCOUNTER — Ambulatory Visit: Payer: Medicare Other | Admitting: Physical Therapy

## 2016-08-03 ENCOUNTER — Encounter: Payer: Self-pay | Admitting: Physical Therapy

## 2016-08-03 DIAGNOSIS — M25571 Pain in right ankle and joints of right foot: Secondary | ICD-10-CM | POA: Diagnosis not present

## 2016-08-03 DIAGNOSIS — R2689 Other abnormalities of gait and mobility: Secondary | ICD-10-CM

## 2016-08-03 NOTE — Therapy (Signed)
[place], [place], [address] Phone: [phone]   Fax:  [phone]  Physical Therapy Treatment  Patient Details  Name: DELORIA BRASSFIELD MRN: [id number] Date of Birth: 1951/03/30 Referring Provider: Dr. Meridee Score  Encounter Date: 08/03/2016      PT End of Session - 08/03/16 1045    Visit Number 11   Number of Visits 12   Date for PT Re-Evaluation 08/11/16   PT Start Time 0852   PT Stop Time 0930   PT Time Calculation (min) 38 min   Activity Tolerance Patient tolerated treatment well   Behavior During Therapy [facility] for tasks assessed/performed      Past Medical History:  Diagnosis Date  . Hyperlipidemia   . Osteopenia   . Thyroid disease     Past Surgical History:  Procedure Laterality Date  . APPENDECTOMY    . MYOTOMY    . RHINOPLASTY    . TONSILLECTOMY      There were no vitals filed for this visit.      Subjective Assessment - 08/03/16 0857    Subjective Just a little pain .  1/10 I walked in the garden with [place]. They were not as supportive.  i have been able to jog 30 seconds.    Currently in Pain? Yes   Pain Score 1    Pain Location Foot   Pain Orientation Right  Pain, discomfort   Pain Type Chronic pain   Pain Frequency Intermittent   Aggravating Factors  different shoes in garden   Pain Relieving Factors elevate   Multiple Pain Sites No                         [facility] Adult PT Treatment/Exercise - 08/03/16 0001      Knee/Hip Exercises: Stretches   Passive Hamstring Stretch 2 reps;10 seconds  both   Piriformis Stretch 3 reps;30 seconds  Both     Knee/Hip Exercises: Aerobic   Nustep L5 7 minutes, legs only     Knee/Hip Exercises: Standing   Heel Raises Both;10 reps  3 positions   SLS 3 seconds left   SLS with Vectors on blue pad 5 X each 3 positions SLR right.   then 5 X each direction small circles.  Pain foot noted after this exercise was over.  3/10   Other Standing Knee Exercises hip hinge  10 X cues for technique.     Knee/Hip Exercises: Supine   Bridges 1 set   Single Leg Bridge 2 sets  1 set each leg 10 X     Knee/Hip Exercises: Sidelying   Hip ADduction 10 reps;2 sets;Both   Clams 1 X 2 sets alternating with ABD.  Min assist fpor hip position.                  PT Short Term Goals - 08/03/16 1048      PT SHORT TERM GOAL #1   Title Pt will be able to correct her gait when cued for posture, heel strike and step length    Time 3   Period Weeks   Status Achieved     PT SHORT TERM GOAL #2   Title Pt will be able to stand on her Rt. LE for 15 sec unsupported for static balance and stability.    Baseline 3 seconds   Time 3   Period Weeks   Status On-going  PT SHORT TERM GOAL #3   Title Pt will perform 20 heel raises without UE support and no pain to demo improved balance and strength    Baseline poor balance and control    Time 3   Period Weeks   Status Partially Met           PT Long Term Goals - 08/03/16 1049      PT LONG TERM GOAL #1   Title Pt will be I with more advanced HEP for LE alignment and gait.    Baseline Independent with exercises issued so far   Time 6   Period Weeks   Status On-going     PT LONG TERM GOAL #2   Title Pt will be able to walk without a limp short distances and no pain in foot.    Time 6   Period Weeks   Status Achieved     PT LONG TERM GOAL #3   Title Pt will be able to walk up steps reciprocally without holding rails and improved confidence.     Baseline able to do with pain in hip left   Time 6   Period Weeks   Status Partially Met     PT LONG TERM GOAL #4   Title Pt will stand on Rt. LE for min dynamic activity  for 30 sec and no pain increase.    Baseline pain increases.   Time 6   Period Weeks   Status On-going               Plan - 08/03/16 1046    Clinical Impression Statement SLS 3 seconds left with wobbles. Pain improving  generally,  Pain increased a few times during session, however if did not limit exercises.  No new goals met. Dynamic activitiess continue to be a challange.   PT Next Visit Plan   Continue core and hip (bridge, hip abd) single leg balance, try Reformer for single leg footwork and even in sidelying for hip with PF   PT Home Exercise Plan standing PF, calf stretch, green T band EV and INV,  standing ankle strength. , bridge and hamstring/ITB , SLS, clam, hip abduction   Consulted and Agree with Plan of Care Patient      Patient will benefit from skilled therapeutic intervention in order to improve the following deficits and impairments:  Pain, Decreased mobility, Abnormal gait, Difficulty walking, Increased fascial restricitons, Decreased balance, Decreased strength, Increased edema  Visit Diagnosis: Pain in right ankle and joints of right foot  Other abnormalities of gait and mobility     Problem List Patient Active Problem List   Diagnosis Date Noted  . Lisfranc's sprain, right, initial encounter 05/01/2016  . Pain in right foot 05/01/2016  . 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

## 2016-08-07 ENCOUNTER — Ambulatory Visit: Payer: Medicare Other | Admitting: Physical Therapy

## 2016-08-10 ENCOUNTER — Ambulatory Visit: Payer: Medicare Other | Admitting: Physical Therapy

## 2016-08-10 ENCOUNTER — Encounter: Payer: Self-pay | Admitting: Physical Therapy

## 2016-08-10 DIAGNOSIS — M25571 Pain in right ankle and joints of right foot: Secondary | ICD-10-CM

## 2016-08-10 DIAGNOSIS — R2689 Other abnormalities of gait and mobility: Secondary | ICD-10-CM | POA: Diagnosis not present

## 2016-08-10 NOTE — Therapy (Signed)
[place], [place], [address] Phone: [phone]   Fax:  [phone]  Physical Therapy Treatment  Patient Details  Name: Janet Holland MRN: [id number] Date of Birth: January 17, 1951 Referring Provider: Dr. Meridee Score  Encounter Date: 08/10/2016      PT End of Session - 08/10/16 0946    Visit Number 12   Number of Visits 12   Date for PT Re-Evaluation 08/11/16   PT Start Time 0905  Short session,  Patient arrived without an appointment,     PT Stop Time 0930   PT Time Calculation (min) 25 min   Activity Tolerance Patient tolerated treatment well   Behavior During Therapy [facility] for tasks assessed/performed      Past Medical History:  Diagnosis Date  . Hyperlipidemia   . Osteopenia   . Thyroid disease     Past Surgical History:  Procedure Laterality Date  . APPENDECTOMY    . MYOTOMY    . RHINOPLASTY    . TONSILLECTOMY      There were no vitals filed for this visit.      Subjective Assessment - 08/10/16 0941    Subjective Pain intermittant.  Feels full this morning vs pain.  I had some soreness last visit but it went away. Band exercises are hard to do.   Currently in Pain? No/denies   Pain Score 1    Pain Location Foot   Pain Orientation Right   Pain Descriptors / Indicators --  Full   Pain Type Chronic pain   Pain Frequency Intermittent   Aggravating Factors  garden work   Pain Relieving Factors elevate   Multiple Pain Sites No            [facility] PT Assessment - 08/10/16 0001      Strength   Right Ankle Dorsiflexion 5/5   Right Ankle Inversion 4/5   Right Ankle Eversion 4/5                     [facility] Adult PT Treatment/Exercise - 08/10/16 0001      Knee/Hip Exercises: Stretches   Gastroc Stretch 3 reps;30 seconds   Gastroc Stretch Limitations incline board     Knee/Hip Exercises: Standing   Other Standing Knee Exercises Marching on blue pad 30 seconds.  side  steps 30 seconds on blue pad.      Knee/Hip Exercises: Supine   Bridges 3 sets;5 reps   Bridges Limitations 1 set both and 1 set each single     Knee/Hip Exercises: Sidelying   Clams 10 X each , care taken to keep top knee "Longer"  monitored hip position.                   PT Short Term Goals - 08/10/16 0950      PT SHORT TERM GOAL #1   Title Pt will be able to correct her gait when cued for posture, heel strike and step length    Status Achieved     PT SHORT TERM GOAL #2   Title Pt will be able to stand on her Rt. LE for 15 sec unsupported for static balance and stability.    Time 3   Period Weeks   Status Unable to assess     PT SHORT TERM GOAL #3   Title Pt will perform 20 heel raises without UE support and no pain to demo improved balance and strength  Time 3   Period Weeks   Status Unable to assess           PT Long Term Goals - 08/10/16 0950      PT LONG TERM GOAL #1   Title Pt will be I with more advanced HEP for LE alignment and gait.    Baseline Bands are difficult to do,  she is independent with exercies added so far   Time 6   Period Weeks   Status On-going     PT LONG TERM GOAL #2   Title Pt will be able to walk without a limp short distances and no pain in foot.    Time 6   Period Weeks   Status Achieved     PT LONG TERM GOAL #3   Title Pt will be able to walk up steps reciprocally without holding rails and improved confidence.     Time 6   Period Weeks   Status Unable to assess     PT LONG TERM GOAL #4   Title Pt will stand on Rt. LE for min dynamic activity  for 30 sec and no pain increase.    Baseline pain increases.   Time 6   Period Weeks   Status On-going               Plan - 08/10/16 0904    Clinical Impression Statement Pain is usually 0.  MMT DF 5/5, IV/EV 4/5   PT Next Visit Plan   Continue core and hip (bridge, hip abd) single leg balance, try Reformer for single leg footwork and even in sidelying for hip  with PF.  POC 08/11/2016.   PT Home Exercise Plan standing PF, calf stretch, green T band EV and INV,  standing ankle strength. , bridge and hamstring/ITB , SLS, clam, hip abduction   Consulted and Agree with Plan of Care Patient      Patient will benefit from skilled therapeutic intervention in order to improve the following deficits and impairments:  Pain, Decreased mobility, Abnormal gait, Difficulty walking, Increased fascial restricitons, Decreased balance, Decreased strength, Increased edema  Visit Diagnosis: Pain in right ankle and joints of right foot  Other abnormalities of gait and mobility     Problem List Patient Active Problem List   Diagnosis Date Noted  . Lisfranc's sprain, right, initial encounter 05/01/2016  . Pain in right foot 05/01/2016  . 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 [place], [place], [address] Phone: [phone]   Fax:  [phone]  Name: NEENA BEECHAM MRN: [id number] Date of Birth: [birth year]

## 2016-08-13 ENCOUNTER — Encounter: Payer: Self-pay | Admitting: Physical Therapy

## 2016-08-13 ENCOUNTER — Ambulatory Visit: Payer: Medicare Other | Admitting: Physical Therapy

## 2016-08-13 DIAGNOSIS — Z1231 Encounter for screening mammogram for malignant neoplasm of breast: Secondary | ICD-10-CM | POA: Diagnosis not present

## 2016-08-13 DIAGNOSIS — R2689 Other abnormalities of gait and mobility: Secondary | ICD-10-CM

## 2016-08-13 DIAGNOSIS — Z6827 Body mass index (BMI) 27.0-27.9, adult: Secondary | ICD-10-CM | POA: Diagnosis not present

## 2016-08-13 DIAGNOSIS — M25571 Pain in right ankle and joints of right foot: Secondary | ICD-10-CM

## 2016-08-13 DIAGNOSIS — Z124 Encounter for screening for malignant neoplasm of cervix: Secondary | ICD-10-CM | POA: Diagnosis not present

## 2016-08-13 NOTE — Therapy (Signed)
[place], [place], [address] Phone: [phone]   Fax:  [phone]  Physical Therapy Treatment/Discharge  Patient Details  Name: Janet Holland MRN: [id number] Date of Birth: 10-09-1950 Referring Provider: Dr. Meridee Score  Encounter Date: 08/13/2016      PT End of Session - 08/13/16 1406    Visit Number 13   Date for PT Re-Evaluation --   PT Start Time 1338   PT Stop Time 1017   PT Time Calculation (min) 37 min      Past Medical History:  Diagnosis Date  . Hyperlipidemia   . Osteopenia   . Thyroid disease     Past Surgical History:  Procedure Laterality Date  . APPENDECTOMY    . MYOTOMY    . RHINOPLASTY    . TONSILLECTOMY      There were no vitals filed for this visit.      Subjective Assessment - 08/13/16 1343    Subjective Knee pain medial L knee.  Was doing a little running and afterwards knee hurt and was swollen.  Foot pain in minimal 1/10 and intermittent.    Currently in Pain? No/denies   Pain Location Foot   Pain Orientation Right   Pain Score 1   Pain Location Knee   Pain Orientation Left   Pain Descriptors / Indicators Tender   Pain Type Acute pain   Pain Onset Yesterday   Aggravating Factors  jogging   Pain Relieving Factors voltaren gel             [facility] PT Assessment - 08/13/16 1347      Single Leg Stance   Comments with practice able to stand on R LE for up to 16 sec     Strength   Right Hip Flexion 4/5   Right Hip Extension 4/5   Right Hip ABduction 4/5  glute 3+/5    Left Hip Flexion 4/5   Left Hip Extension 4/5   Left Hip ABduction 4/5  glute med 4-/5   Right Knee Flexion 4+/5   Right Knee Extension 4+/5   Left Knee Flexion 4+/5   Left Knee Extension 4+/5   Right Ankle Dorsiflexion 5/5   Right Ankle Inversion 4/5   Right Ankle Eversion 5/5                     [facility] Adult PT Treatment/Exercise - 08/13/16 1350      Self-Care   Other Self-Care Comments  DC, HEP and knee considerations,, avoid running, RICE      Knee/Hip Exercises: Stretches   Active Hamstring Stretch Both;3 reps;30 seconds   Other Knee/Hip Stretches stretch off step 30 sec x 3      Knee/Hip Exercises: Standing   Heel Raises Both;1 set;20 reps;5 seconds     Knee/Hip Exercises: Supine   Bridges Strengthening;Both;15 reps   Single Leg Bridge Strengthening;Both;1 set;5 reps     Knee/Hip Exercises: Sidelying   Hip ABduction Strengthening;Both;1 set;15 reps     Ankle Exercises: Seated   Other Seated Ankle Exercises blue band eversion Rt. LE x 15 reps      Ankle Exercises: Standing   SLS balance assessed, less than 10 sec most of the trials , each leg, symmetrical                 PT Education - 08/13/16 1406    Education provided Yes   Education Details progress, goals, HEP  Person(s) Educated Patient   Methods Explanation;Verbal cues   Comprehension Verbalized understanding          PT Short Term Goals - 2016/08/28 1609      PT SHORT TERM GOAL #1   Title Pt will be able to correct her gait when cued for posture, heel strike and step length    Status Achieved     PT SHORT TERM GOAL #2   Title Pt will be able to stand on her Rt. LE for 15 sec unsupported for static balance and stability.    Status Achieved     PT SHORT TERM GOAL #3   Title Pt will perform 20 heel raises without UE support and no pain to demo improved balance and strength    Baseline min pain, fair balance    Status Partially Met           PT Long Term Goals - Aug 28, 2016 1610      PT LONG TERM GOAL #1   Title Pt will be I with more advanced HEP for LE alignment and gait.    Status Achieved     PT LONG TERM GOAL #2   Title Pt will be able to walk without a limp short distances and no pain in foot.    Status Achieved     PT LONG TERM GOAL #3   Title Pt will be able to walk up steps reciprocally without holding rails and improved confidence.      Baseline min, much improved from onset    Status Achieved     PT LONG TERM GOAL #4   Title Pt will stand on Rt. LE for min dynamic activity  for 30 sec and no pain increase.    Baseline can do for < 15 sec    Status Partially Met               Plan - Aug 28, 2016 1607    Clinical Impression Statement Patient has met LTG, partially met goal for single leg balance but this is symmetrical.  She is able to garden, walk with much less pain.  Only limps occasionally.  PLeased with progress, DC. She will monitor L knee pain and swelling, stop running.    PT Next Visit Plan DC   PT Home Exercise Plan standing PF, calf stretch, green T band EV and INV,  standing ankle strength. , bridge and hamstring/ITB , SLS, clam, hip abduction   Consulted and Agree with Plan of Care Patient      Patient will benefit from skilled therapeutic intervention in order to improve the following deficits and impairments:  Pain, Decreased mobility, Abnormal gait, Difficulty walking, Increased fascial restricitons, Decreased balance, Decreased strength, Increased edema  Visit Diagnosis: Pain in right ankle and joints of right foot  Other abnormalities of gait and mobility       G-Codes - 2016-08-28 1611    Functional Assessment Tool Used (Outpatient Only) FOTO/clinical judgemetnt    Functional Limitation Mobility: Walking and moving around   Mobility: Walking and Moving Around Current Status [phone]) At least 20 percent but less than 40 percent impaired, limited or restricted   Mobility: Walking and Moving Around Goal Status [phone]) At least 20 percent but less than 40 percent impaired, limited or restricted   Mobility: Walking and Moving Around Discharge Status [phone]) At least 20 percent but less than 40 percent impaired, limited or restricted      Problem List Patient Active Problem List  Diagnosis Date Noted  . Lisfranc's sprain, right, initial encounter 05/01/2016  . Pain in right foot 05/01/2016  .  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 [place], [place], [address] Phone: [phone]   Fax:  [phone]  Name: Janet Holland MRN: [id number] Date of Birth: Jul 14, 1950  PHYSICAL THERAPY DISCHARGE SUMMARY  Visits from Start of Care: 13  Current functional level related to goals / functional outcomes: See above     Remaining deficits: Weakness in LEs, balance, min limp when pain in ankle/foot    Education / Equipment: HEP, Self care, RICE , balance and gait  Plan: Patient agrees to discharge.  Patient goals were not met. Patient is being discharged due to meeting the stated rehab goals.  ?????    Raeford Razor, PT 08/13/16 4:17 PM Phone: [phone] Fax: [phone]

## 2016-09-02 IMAGING — US US SOFT TISSUE HEAD/NECK
1 series · 13 of 25 positions shown · non-contrast
Comparison: Prior thyroid ultrasound 04/12/2013; prior thyroid
nodule biopsy images 1 12/17/2013

CLINICAL DATA: 64-year-old female with multiple thyroid nodules.
Prior biopsy of the 1.7 cm right lower pole nodule on 04/27/2013.

EXAM:
THYROID ULTRASOUND
TECHNIQUE: Ultrasound examination of the thyroid gland and adjacent soft
tissues was performed.

[Series 1: us soft tissue head/neck · 0.06mm/px · 13 of 69 slices shown]
[im 1/69]
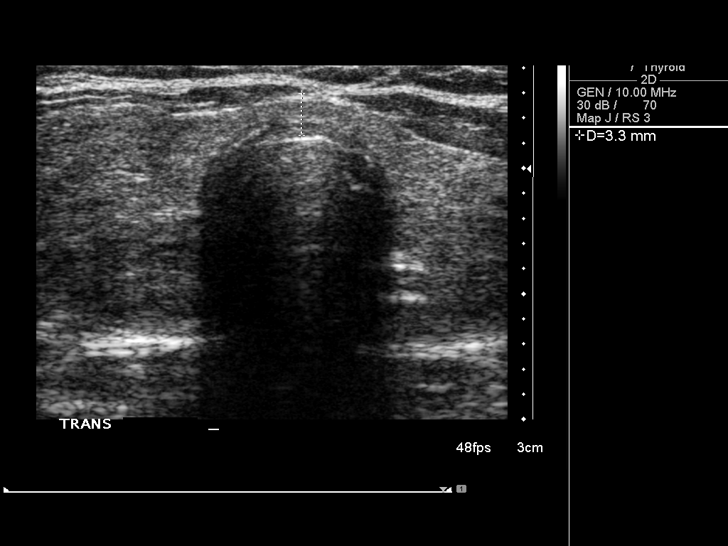
[im 6/69]
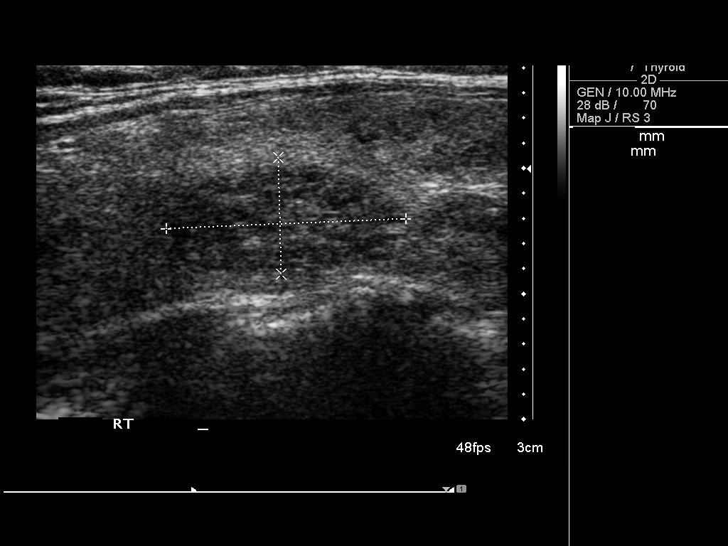
[im 12/69]
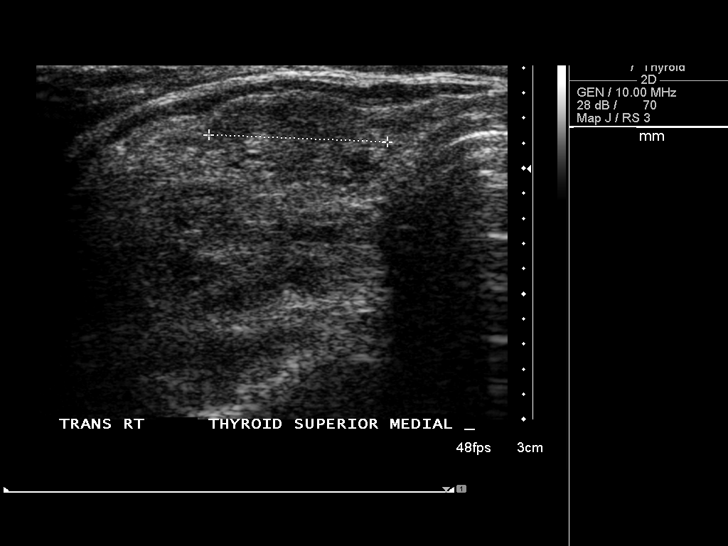
[im 18/69]
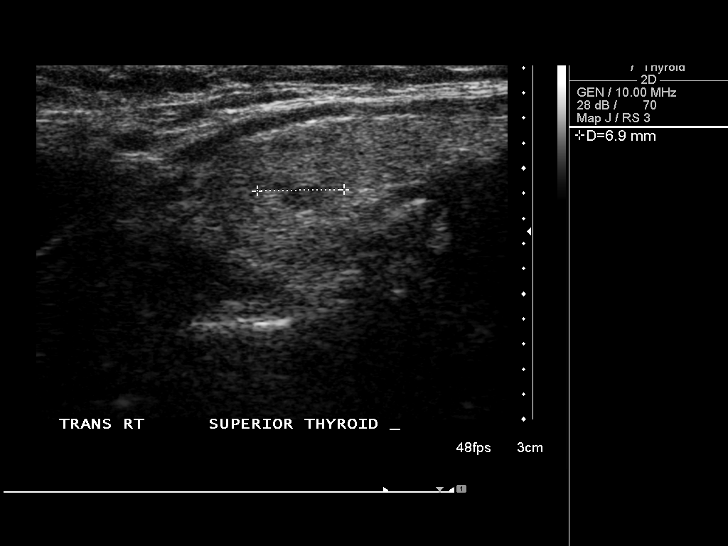
[im 23/69]
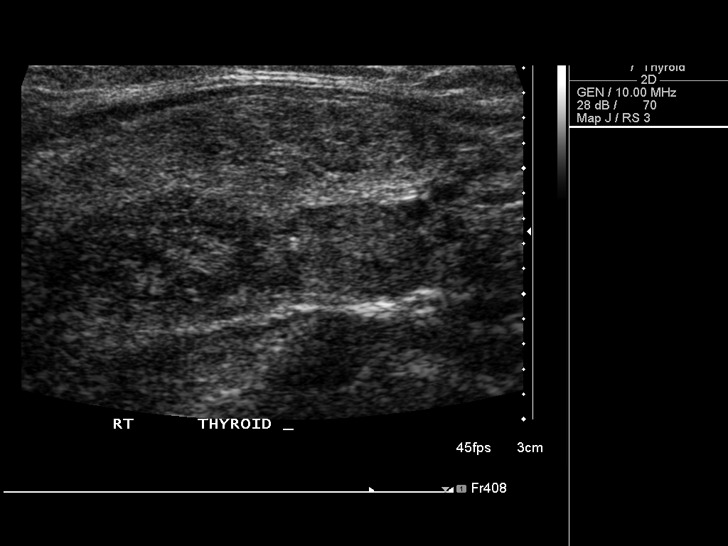
[im 29/69]
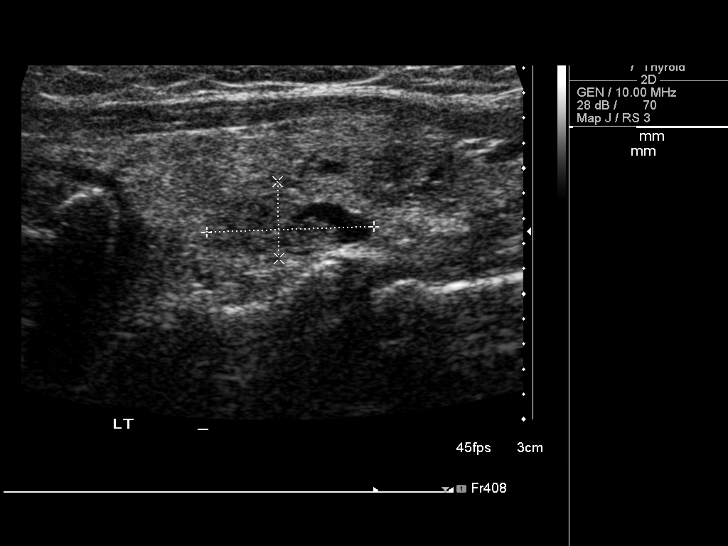
[im 35/69]
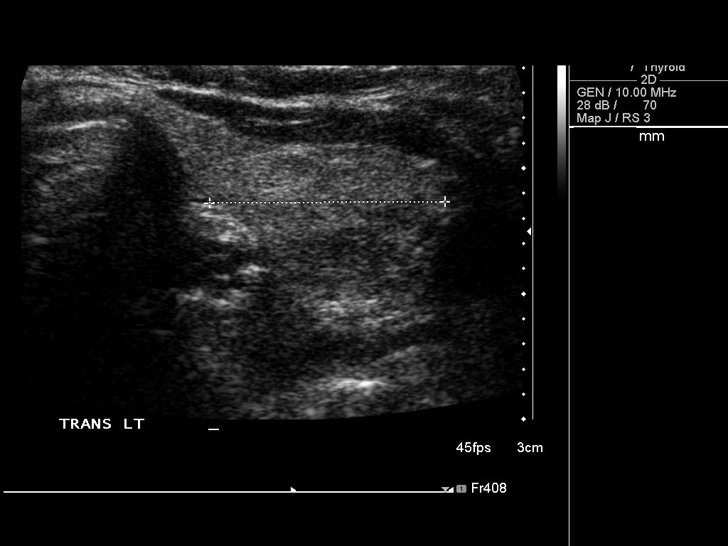
[im 40/69]
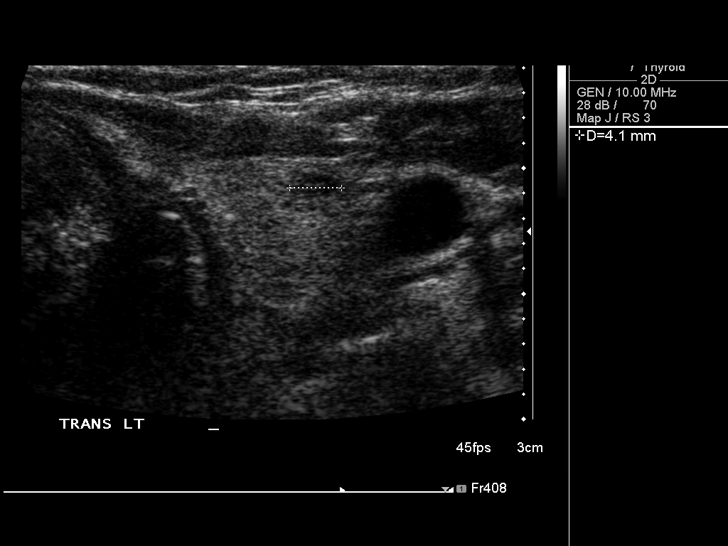
[im 46/69]
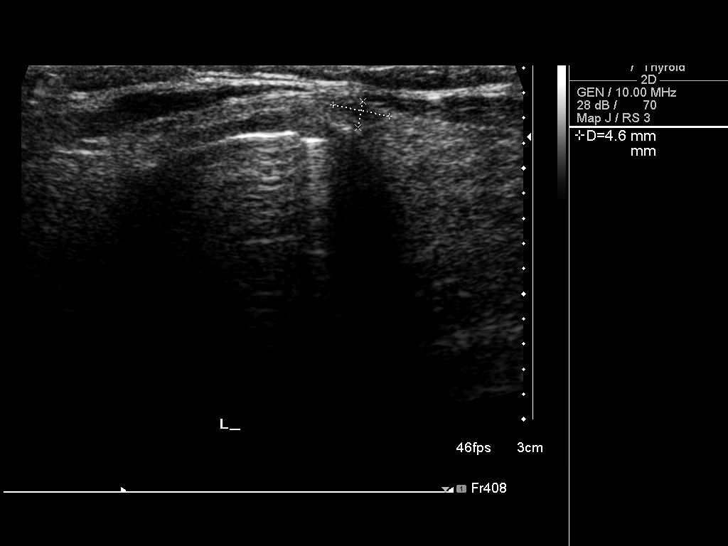
[im 52/69]
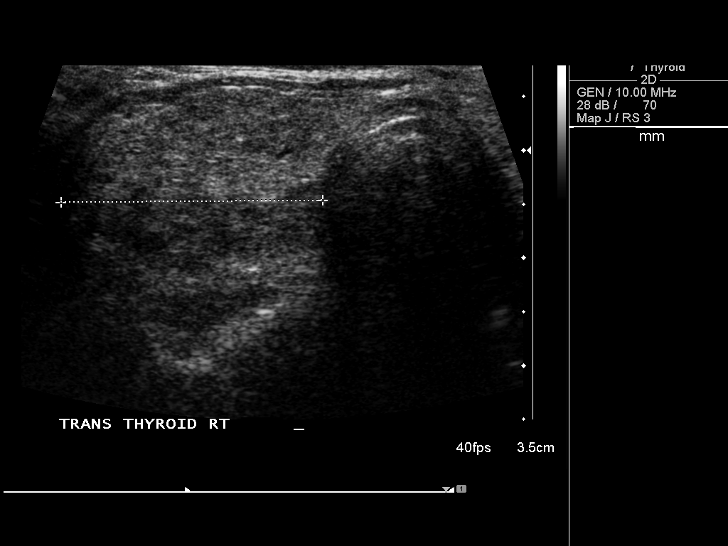
[im 57/69]
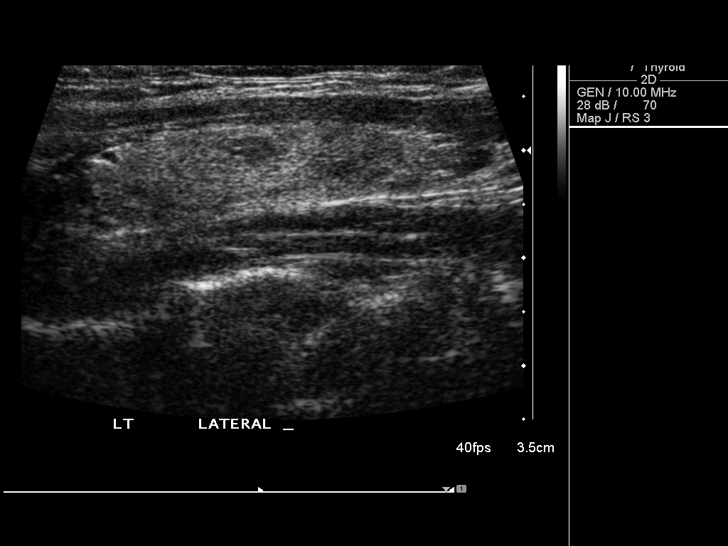
[im 63/69]
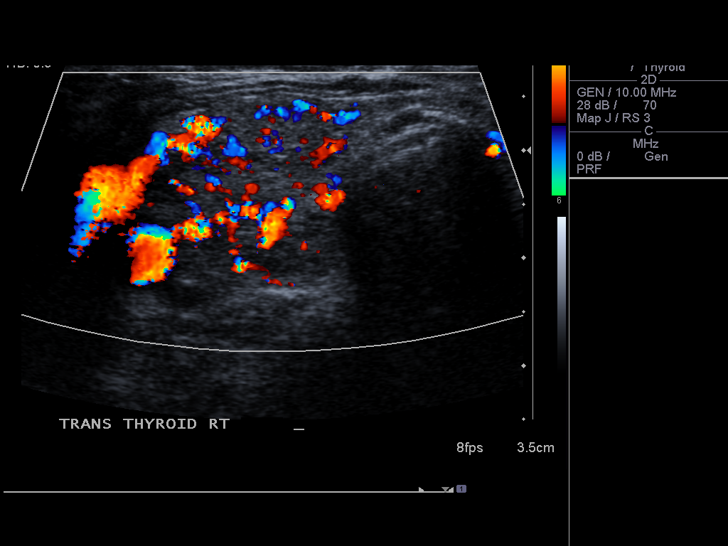
[im 69/69]
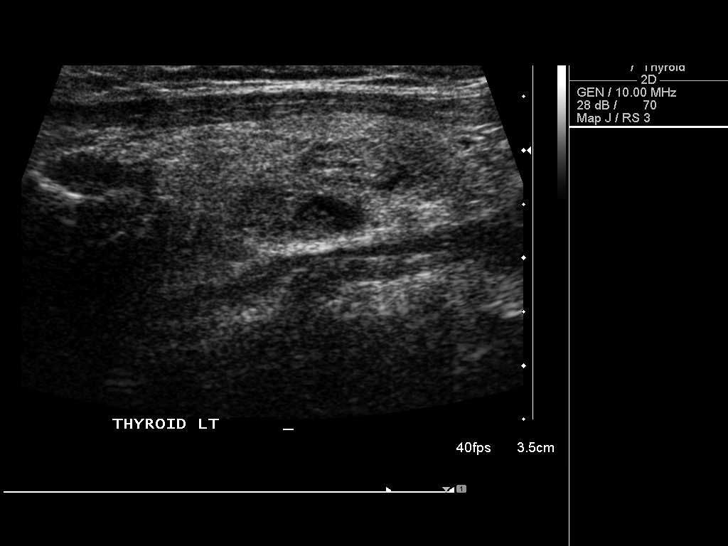

[13 of 25 positions shown; findings below may reference images not displayed]

FINDINGS: Right thyroid lobe

Measurements: 5.1 x 1.9 x 2.4 cm. Diffusely heterogeneous thyroid
gland. A cluster of nodules is present in the mid to lower gland.

1. Ovoid hypoechoic nodule in the more lateral aspect of the lower
gland measures 19 x 9 x 16 mm. This nodule was previously measured
at 1.4 cm, however the nodule is measured today in continuity with
an adjacent and abutting smaller nodule. Using similar measurements
on the prior study, the nodule previously measured 18 mm which is
insignificantly different compared to today's evaluation.
2. Hypoechoic solid nodule in the medial aspect of the lower gland
measures 17 x 9 x 13 mm which is insignificantly changed.
3. Ovoid hypoechoic solid nodule in the superficial aspect of the
mid to lower gland measures approximately 18 x 7 x 14 mm which is
insignificantly changed compared to 19 x 8 by 13 mm previously.
4. Additional 7 mm hypoechoic solid nodule in the upper gland.
Left thyroid lobe

Measurements: 4 x 1.3 x 1.9 cm. Diffusely heterogeneous thyroid
parenchyma.

1. Predominantly solid nodule in the medial aspect of the lower
gland measures 13 x 6 x 10 mm.
2. An adjacent hypoechoic solid nodule in the lower gland measures
13 x 6 x 11 mm.
3. Tiny 6 mm hypoechoic nodule in the upper gland.
Isthmus

Thickness: 0.3 cm. Diffusely heterogeneous. Tiny hypoechoic nodule
in the mid isthmus measuring up to 5 mm.

Lymphadenopathy

None visualized.
IMPRESSION: 1. Multiple bilateral thyroid nodules. The sonographic imaging
appearance is most suggestive of multinodular goiter.
2. There has been no significant interval change in the size, number
or configuration of the thyroid nodules compared to 04/12/2013.

## 2016-10-02 ENCOUNTER — Ambulatory Visit (INDEPENDENT_AMBULATORY_CARE_PROVIDER_SITE_OTHER): Payer: Medicare Other | Admitting: Orthopedic Surgery

## 2016-10-02 ENCOUNTER — Encounter (INDEPENDENT_AMBULATORY_CARE_PROVIDER_SITE_OTHER): Payer: Self-pay | Admitting: Family

## 2016-10-02 ENCOUNTER — Ambulatory Visit (INDEPENDENT_AMBULATORY_CARE_PROVIDER_SITE_OTHER): Payer: Medicare Other

## 2016-10-02 VITALS — Ht 63.0 in | Wt 150.0 lb

## 2016-10-02 DIAGNOSIS — M79642 Pain in left hand: Secondary | ICD-10-CM

## 2016-10-02 NOTE — Progress Notes (Signed)
Office Visit Note   Patient: Janet Holland           Date of Birth: 05/10/1950           MRN: [id number] Visit Date: 10/02/2016              Requested by: Practice, [place] [id number] [place], [place] [address] PCP: Chesley Noon, MD  Chief Complaint  Patient presents with  . Left Hand - Pain      HPI: Patient is a 66 year old woman who states she jumped off a dock holding the float when she had a abduction injury to her left thumb she has been wearing a thumb spica splint but states she still has pain globally around her thumb.  Assessment & Plan: Visit Diagnoses:  1. Pain in left hand     Plan: Recommend that she continue use the splint as needed for comfort and will follow up as needed.  Follow-Up Instructions: Return if symptoms worsen or fail to improve.   Ortho Exam  Patient is alert, oriented, no adenopathy, well-dressed, normal affect, normal respiratory effort. Examination patient has a negative Finkelstein's test no tenderness to palpation of the first dorsal extensor compartment. Her thumb is neurovascularly intact she has full extension and flexion. She has no tenderness to palpation the base of the carpometacarpal joint. The left thumb with a valgus stress does not produce pain over the collateral ligaments of the thumb. She has a little bit of pain globally to palpation around the thumb. No evidence of instability of the carpometacarpal joint no evidence of degenerative arthritis at the base of the first metacarpal no tendon or neurologic injury.  Imaging: Xr Hand Complete Left  Result Date: 10/02/2016 Three-view radiographs were obtained of the left thumb. The base of the first metacarpal carpal joint is stable without degenerative changes. There is no evidence of any avulsion fractures of the MCP joint of the thumb. The joints are congruent.   Labs: No results found for: HGBA1C, ESRSEDRATE, CRP, LABURIC, REPTSTATUS,  GRAMSTAIN, CULT, LABORGA  Orders:  Orders Placed This Encounter  Procedures  . XR Hand Complete Left   No orders of the defined types were placed in this encounter.    Procedures: No procedures performed  Clinical Data: No additional findings.  ROS:  All other systems negative, except as noted in the HPI. Review of Systems  Objective: Vital Signs: Ht 5\' 3"  (1.6 m)   Wt 150 lb (68 kg)   BMI 26.57 kg/m   Specialty Comments:  No specialty comments available.  PMFS History: Patient Active Problem List   Diagnosis Date Noted  . Lisfranc's sprain, right, initial encounter 05/01/2016  . Pain in right foot 05/01/2016  . 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 Hyperlipidemia   . Osteopenia   . Thyroid disease     Family History  Problem Relation Age of Onset  . Melanoma Brother   . Breast cancer Sister   . Ovarian cancer Maternal Grandmother   . Colon cancer Paternal Grandfather   . Heart attack Father     Past Surgical History:  Procedure Laterality Date  . APPENDECTOMY    . MYOTOMY    . RHINOPLASTY    . TONSILLECTOMY     Social History   Occupational History  . Not on file.   Social History Main Topics  . Smoking status: Former Audiological scientist  date: 03/30/1978  . Smokeless tobacco: Never Used  . Alcohol use Yes  . Drug use: No  . Sexual activity: Not on file

## 2017-04-05 ENCOUNTER — Ambulatory Visit (INDEPENDENT_AMBULATORY_CARE_PROVIDER_SITE_OTHER): Payer: Medicare Other | Admitting: Orthopedic Surgery

## 2017-04-05 ENCOUNTER — Encounter (INDEPENDENT_AMBULATORY_CARE_PROVIDER_SITE_OTHER): Payer: Self-pay | Admitting: Orthopedic Surgery

## 2017-04-05 ENCOUNTER — Other Ambulatory Visit (INDEPENDENT_AMBULATORY_CARE_PROVIDER_SITE_OTHER): Payer: Self-pay

## 2017-04-05 VITALS — Ht 63.0 in | Wt 150.0 lb

## 2017-04-05 DIAGNOSIS — M65332 Trigger finger, left middle finger: Secondary | ICD-10-CM

## 2017-04-05 DIAGNOSIS — G8929 Other chronic pain: Secondary | ICD-10-CM

## 2017-04-05 DIAGNOSIS — M720 Palmar fascial fibromatosis [Dupuytren]: Secondary | ICD-10-CM | POA: Diagnosis not present

## 2017-04-05 DIAGNOSIS — M1812 Unilateral primary osteoarthritis of first carpometacarpal joint, left hand: Secondary | ICD-10-CM | POA: Diagnosis not present

## 2017-04-05 DIAGNOSIS — M79645 Pain in left finger(s): Principal | ICD-10-CM

## 2017-04-05 NOTE — Progress Notes (Signed)
Office Visit Note   Patient: Janet Holland           Date of Birth: 03/21/1951           MRN: [id number] Visit Date: 04/05/2017              Requested by: Chesley Noon, MD [address] [place], [place] [address] PCP: Chesley Noon, MD  Chief Complaint  Patient presents with  . Left Hand - Pain    Thumb pain      HPI: Patient is a 67 year old woman who presents with several complaints she states she has thumb of the base of the left thumb she has weakness she states that she feels like she will drop things without support.  She also has episodes of the long finger triggering which she states they are not bothering her at this time she also has states she has some nodules in the palmar fascia on the left.  Denies any nodules in her foot.  Assessment & Plan: Visit Diagnoses:  1. Palmar fascial fibromatosis (dupuytren)   2. Trigger finger, left middle finger   3. Primary osteoarthritis of first carpometacarpal joint of left hand     Plan: Patient is tried all conservative options she has tried bracing therapy anti-inflammatories without relief.  We will refer her to Dr. Burney Gauze for evaluation for carpometacarpal arthroplasty left thumb.  Follow-Up Instructions: Return if symptoms worsen or fail to improve.   Ortho Exam  Patient is alert, oriented, no adenopathy, well-dressed, normal affect, normal respiratory effort. Examination patient's hand is neurovascular intact she has good pulses she has some mild nodules in the palmar fascia consistent with Dupuytren's, there is no contracture she has full range of motion of all fingers.  She has no nodules in her plantar fascia no plantar fibromatosis.  Her ulnar collateral ligament of the thumb is strong, she does not have any triggering to the long finger on examination today.  Grip strength is weak.  She has no nodules of the PIP or DIP joints.  Imaging: No results found. No images are attached to the  encounter.  Labs: No results found for: HGBA1C, ESRSEDRATE, CRP, LABURIC, REPTSTATUS, GRAMSTAIN, CULT, LABORGA  @LABSALLVALUES (HGBA1)@  Body mass index is 26.57 kg/m.  Orders:  No orders of the defined types were placed in this encounter.  No orders of the defined types were placed in this encounter.    Procedures: No procedures performed  Clinical Data: No additional findings.  ROS:  All other systems negative, except as noted in the HPI. Review of Systems  Objective: Vital Signs: Ht 5\' 3"  (1.6 m)   Wt 150 lb (68 kg)   BMI 26.57 kg/m   Specialty Comments:  No specialty comments available.  PMFS History: Patient Active Problem List   Diagnosis Date Noted  . Lisfranc's sprain, right, initial encounter 05/01/2016  . Pain in right foot 05/01/2016  . 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 Hyperlipidemia   . Osteopenia   . Thyroid disease     Family History  Problem Relation Age of Onset  . Melanoma Brother   . Breast cancer Sister   . Ovarian cancer Maternal Grandmother   . Colon cancer Paternal Grandfather   . Heart attack Father     Past Surgical History:  Procedure Laterality Date  . APPENDECTOMY    . MYOTOMY    . RHINOPLASTY    . TONSILLECTOMY  Social History   Occupational History  . Not on file  Tobacco Use  . Smoking status: Former Smoker    Last attempt to quit: 03/30/1978    Years since quitting: 39.0  . Smokeless tobacco: Never Used  Substance and Sexual Activity  . Alcohol use: Yes  . Drug use: No  . Sexual activity: Not on file

## 2017-06-28 ENCOUNTER — Other Ambulatory Visit: Payer: Self-pay | Admitting: Surgery

## 2017-06-28 DIAGNOSIS — E042 Nontoxic multinodular goiter: Secondary | ICD-10-CM

## 2017-07-30 ENCOUNTER — Ambulatory Visit
Admission: RE | Admit: 2017-07-30 | Discharge: 2017-07-30 | Disposition: A | Discharge status: Home / Self Care | Payer: Medicare Other | Source: Ambulatory Visit | Attending: Surgery | Admitting: Surgery

## 2017-07-30 DIAGNOSIS — E042 Nontoxic multinodular goiter: Secondary | ICD-10-CM | POA: Diagnosis not present

## 2017-08-16 DIAGNOSIS — Z01419 Encounter for gynecological examination (general) (routine) without abnormal findings: Secondary | ICD-10-CM | POA: Diagnosis not present

## 2017-08-16 DIAGNOSIS — M816 Localized osteoporosis [Lequesne]: Secondary | ICD-10-CM | POA: Diagnosis not present

## 2017-08-16 DIAGNOSIS — Z803 Family history of malignant neoplasm of breast: Secondary | ICD-10-CM | POA: Diagnosis not present

## 2017-08-16 DIAGNOSIS — Z1231 Encounter for screening mammogram for malignant neoplasm of breast: Secondary | ICD-10-CM | POA: Diagnosis not present

## 2017-08-16 DIAGNOSIS — N958 Other specified menopausal and perimenopausal disorders: Secondary | ICD-10-CM | POA: Diagnosis not present

## 2017-08-16 DIAGNOSIS — Z6826 Body mass index (BMI) 26.0-26.9, adult: Secondary | ICD-10-CM | POA: Diagnosis not present

## 2017-09-10 ENCOUNTER — Other Ambulatory Visit: Payer: Self-pay | Admitting: Surgery

## 2017-09-10 DIAGNOSIS — E042 Nontoxic multinodular goiter: Secondary | ICD-10-CM

## 2017-09-23 ENCOUNTER — Telehealth (INDEPENDENT_AMBULATORY_CARE_PROVIDER_SITE_OTHER): Payer: Self-pay | Admitting: *Deleted

## 2017-09-23 NOTE — Telephone Encounter (Signed)
Pt called left vm stating she has not heard about her referral that was [facility]. SHe called and they stated never received referral and to please resubmit referral. I sent referral by email to [place] with [facility], she will review and call pt to schedule appt

## 2017-10-06 ENCOUNTER — Ambulatory Visit (INDEPENDENT_AMBULATORY_CARE_PROVIDER_SITE_OTHER): Payer: Medicare Other

## 2017-10-06 ENCOUNTER — Ambulatory Visit (INDEPENDENT_AMBULATORY_CARE_PROVIDER_SITE_OTHER): Payer: Medicare Other | Admitting: Family

## 2017-10-06 ENCOUNTER — Encounter (INDEPENDENT_AMBULATORY_CARE_PROVIDER_SITE_OTHER): Payer: Self-pay | Admitting: Family

## 2017-10-06 VITALS — Ht 63.0 in | Wt 150.0 lb

## 2017-10-06 DIAGNOSIS — M79672 Pain in left foot: Secondary | ICD-10-CM

## 2017-10-06 DIAGNOSIS — S92512A Displaced fracture of proximal phalanx of left lesser toe(s), initial encounter for closed fracture: Secondary | ICD-10-CM | POA: Diagnosis not present

## 2017-10-06 NOTE — Progress Notes (Signed)
Office Visit Note   Patient: Janet Holland           Date of Birth: 1950/09/14           MRN: [id number] Visit Date: 10/06/2017              Requested by: Chesley Noon, MD [address] [place], [place] [address] PCP: Chesley Noon, MD  Chief Complaint  Patient presents with  . Left Foot - Pain      HPI: The patient is a 67 year old woman who is seen today for evaluation of left little toe pain following dropping a box of conch shells on her toe. States the toe was immediately swollen and bruised. States was also displaced, pointing laterally. Her sister in law reduced the angled toe. Has been full weight bearing in stiff shoe wear. Has previously fractured the 5th MT on same foot. Presents today concerned she may have re injured the 5th MT and toe.  Assessment & Plan: Visit Diagnoses:  1. Pain in left foot   2. Closed displaced fracture of proximal phalanx of lesser toe of left foot, initial encounter     Plan: stiff soled walking shoe. Advance activities as tolerated. May follow up as needed. Rest, ice and nsaids.  Follow-Up Instructions: Return if symptoms worsen or fail to improve.   Ortho Exam  Patient is alert, oriented, no adenopathy, well-dressed, normal affect, normal respiratory effort. On examination the 5th toe is ecchymotic and swollen. No visible deformity. 5th MT nontender. Flexion and extension intact.  Imaging: No results found. No images are attached to the encounter.  Labs: No results found for: HGBA1C, ESRSEDRATE, CRP, LABURIC, REPTSTATUS, GRAMSTAIN, CULT, LABORGA   No results found for: ALBUMIN, PREALBUMIN, LABURIC  Body mass index is 26.57 kg/m.  Orders:  Orders Placed This Encounter  Procedures  . XR Foot 2 Views Right   No orders of the defined types were placed in this encounter.    Procedures: No procedures performed  Clinical Data: No additional findings.  ROS:  All other systems negative, except as noted in  the HPI. Review of Systems  Constitutional: Negative for chills and fever.  Musculoskeletal: Positive for arthralgias and joint swelling.    Objective: Vital Signs: Ht 5\' 3"  (1.6 m)   Wt 150 lb (68 kg)   BMI 26.57 kg/m   Specialty Comments:  No specialty comments available.  PMFS History: Patient Active Problem List   Diagnosis Date Noted  . Lisfranc's sprain, right, initial encounter 05/01/2016  . Pain in right foot 05/01/2016  . 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 Hyperlipidemia   . Osteopenia   . Thyroid disease     Family History  Problem Relation Age of Onset  . Melanoma Brother   . Breast cancer Sister   . Ovarian cancer Maternal Grandmother   . Colon cancer Paternal Grandfather   . Heart attack Father     Past Surgical History:  Procedure Laterality Date  . APPENDECTOMY    . MYOTOMY    . RHINOPLASTY    . TONSILLECTOMY     Social History   Occupational History  . Not on file  Tobacco Use  . Smoking status: Former Smoker    Last attempt to quit: 03/30/1978    Years since quitting: 39.5  . Smokeless tobacco: Never Used  Substance and Sexual Activity  . Alcohol use: Yes  . Drug use:  No  . Sexual activity: Not on file

## 2017-10-07 ENCOUNTER — Other Ambulatory Visit (HOSPITAL_COMMUNITY)
Admission: RE | Admit: 2017-10-07 | Discharge: 2017-10-07 | Disposition: A | Discharge status: Home / Self Care | Payer: Medicare Other | Source: Ambulatory Visit | Attending: Radiology | Admitting: Radiology

## 2017-10-07 ENCOUNTER — Ambulatory Visit
Admission: RE | Admit: 2017-10-07 | Discharge: 2017-10-07 | Disposition: A | Discharge status: Home / Self Care | Payer: Medicare Other | Source: Ambulatory Visit | Attending: Surgery | Admitting: Surgery

## 2017-10-07 ENCOUNTER — Ambulatory Visit (INDEPENDENT_AMBULATORY_CARE_PROVIDER_SITE_OTHER): Payer: Medicare Other | Admitting: Family

## 2017-10-07 DIAGNOSIS — E042 Nontoxic multinodular goiter: Secondary | ICD-10-CM

## 2017-10-07 DIAGNOSIS — E041 Nontoxic single thyroid nodule: Secondary | ICD-10-CM | POA: Diagnosis not present

## 2017-10-21 DIAGNOSIS — M720 Palmar fascial fibromatosis [Dupuytren]: Secondary | ICD-10-CM | POA: Diagnosis not present

## 2017-10-21 DIAGNOSIS — M1812 Unilateral primary osteoarthritis of first carpometacarpal joint, left hand: Secondary | ICD-10-CM | POA: Diagnosis not present

## 2017-10-25 DIAGNOSIS — E042 Nontoxic multinodular goiter: Secondary | ICD-10-CM | POA: Diagnosis not present

## 2017-11-19 DIAGNOSIS — E782 Mixed hyperlipidemia: Secondary | ICD-10-CM | POA: Diagnosis not present

## 2017-11-19 DIAGNOSIS — Z0000 Encounter for general adult medical examination without abnormal findings: Secondary | ICD-10-CM | POA: Diagnosis not present

## 2017-11-19 DIAGNOSIS — E042 Nontoxic multinodular goiter: Secondary | ICD-10-CM | POA: Diagnosis not present

## 2018-02-23 DIAGNOSIS — Z23 Encounter for immunization: Secondary | ICD-10-CM | POA: Diagnosis not present

## 2018-05-02 ENCOUNTER — Ambulatory Visit (INDEPENDENT_AMBULATORY_CARE_PROVIDER_SITE_OTHER): Payer: Medicare Other

## 2018-05-02 ENCOUNTER — Ambulatory Visit (INDEPENDENT_AMBULATORY_CARE_PROVIDER_SITE_OTHER): Payer: Medicare Other | Admitting: Physician Assistant

## 2018-05-02 ENCOUNTER — Encounter (INDEPENDENT_AMBULATORY_CARE_PROVIDER_SITE_OTHER): Payer: Self-pay | Admitting: Orthopedic Surgery

## 2018-05-02 ENCOUNTER — Ambulatory Visit (INDEPENDENT_AMBULATORY_CARE_PROVIDER_SITE_OTHER): Payer: Medicare Other | Admitting: Orthopedic Surgery

## 2018-05-02 VITALS — Ht 63.0 in | Wt 150.0 lb

## 2018-05-02 DIAGNOSIS — M25512 Pain in left shoulder: Secondary | ICD-10-CM

## 2018-05-02 DIAGNOSIS — M25551 Pain in right hip: Secondary | ICD-10-CM

## 2018-05-02 DIAGNOSIS — M545 Low back pain, unspecified: Secondary | ICD-10-CM

## 2018-05-02 DIAGNOSIS — M542 Cervicalgia: Secondary | ICD-10-CM

## 2018-05-02 DIAGNOSIS — M7542 Impingement syndrome of left shoulder: Secondary | ICD-10-CM

## 2018-05-02 DIAGNOSIS — M5431 Sciatica, right side: Secondary | ICD-10-CM | POA: Diagnosis not present

## 2018-05-02 MED ORDER — PREDNISONE 10 MG PO TABS: 20.0000 mg | ORAL_TABLET | Freq: Every day | ORAL | 1 refills | Status: DC

## 2018-05-02 NOTE — Progress Notes (Signed)
Office Visit Note   Patient: Janet Holland           Date of Birth: 1951/02/15           MRN: [id number] Visit Date: 05/02/2018              Requested by: Chesley Noon, MD [address] [place], [place] [address] PCP: Chesley Noon, MD  Chief Complaint  Patient presents with  . Left Shoulder - Pain  . Right Hip - Pain      HPI: The patient is a 68 yo woman who is seen for left shoulder pain and  right hip pain. She reports her shoulder has been bothering her for about 3 months and she is concerned she has a rotator cuff injury. 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 She reports she is having trouble reaching for things over her head and behind her body.  She is also having pain in the right hip and reports it is worse with sitting for longer periods.  She denies pain over the groin area and indicates the buttock area and lateral hip area when she is pointing to where her area of maximal pain with the hip pills.  She denies any history of low back pain.  Denies any bowel or bladder dysfunction.  She reports history of osteopenia and is on Boniva monthly. She has no history of direct trauma to the hip or the shoulder. Her hip has been bothering her for about 6 months now. She has not been taking anything for the pain.   Assessment & Plan: Visit Diagnoses:  1. Left shoulder pain, unspecified chronicity   2. Sciatica, right side   3. Impingement syndrome of left shoulder   4. Pain of right hip joint   5. Cervicalgia   6. Low back pain, unspecified back pain laterality, unspecified chronicity, unspecified whether sciatica present     Plan: Will begin Prednisone 20 mg Daily with breakfast and taper to 10 mg daily after one week, then 10 mg every other day for an additional week if symptoms are improving. She will follow up in about 4 weeks.   Follow-Up Instructions: Return in about 4 weeks (around  05/30/2018).   Ortho Exam  Patient is alert, oriented, no adenopathy, well-dressed, normal affect, normal respiratory effort. Examination of left shoulder shows decreased forward flexion as well as decreased internal and external rotation with pain on these motions.  She has negative drop arm.  She is neurovascularly intact.  Right lower extremity has good range of motion at the hip.  She has weakly positive straight leg raise on the right.  She is neurovascularly intact distally.  She has good each EHL.  Her strength is good.  Imaging: No results found. No images are attached to the encounter.  Labs: No results found for: HGBA1C, ESRSEDRATE, CRP, LABURIC, REPTSTATUS, GRAMSTAIN, CULT, LABORGA   No results found for: ALBUMIN, PREALBUMIN, LABURIC  Body mass index is 26.57 kg/m.  Orders:  Orders Placed This Encounter  Procedures  . XR Shoulder Left  . XR HIP UNILAT W OR W/O PELVIS 2-3 VIEWS RIGHT  . XR Lumbar Spine 2-3 Views  . XR Cervical Spine 2 or 3 views   Meds ordered this encounter  Medications  . predniSONE (DELTASONE) 10 MG tablet    Sig: Take 2 tablets (20 mg total) by mouth daily with breakfast.  Dispense:  60 tablet    Refill:  1    20 mg daily x 1 week, then decrease to 10 mg daily x 1 week, then 10 mg every other day x 1 week and then stop if improved.     Procedures: No procedures performed  Clinical Data: No additional findings.  ROS:  All other systems negative, except as noted in the HPI. Review of Systems  Objective: Vital Signs: Ht 5\' 3"  (1.6 m)   Wt 150 lb (68 kg)   BMI 26.57 kg/m   Specialty Comments:  No specialty comments available.  PMFS History: Patient Active Problem List   Diagnosis Date Noted  . Lisfranc's sprain, right, initial encounter 05/01/2016  . Pain in right foot 05/01/2016  . 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 Hyperlipidemia   . Osteopenia   . Thyroid disease       Family History  Problem Relation Age of Onset  . Melanoma Brother   . Breast cancer Sister   . Ovarian cancer Maternal Grandmother   . Colon cancer Paternal Grandfather   . Heart attack Father     Past Surgical History:  Procedure Laterality Date  . APPENDECTOMY    . MYOTOMY    . RHINOPLASTY    . TONSILLECTOMY     Social History   Occupational History  . Not on file  Tobacco Use  . Smoking status: Former Smoker    Last attempt to quit: 03/30/1978    Years since quitting: 40.1  . Smokeless tobacco: Never Used  Substance and Sexual Activity  . Alcohol use: Yes  . Drug use: No  . Sexual activity: Not on file

## 2018-05-09 ENCOUNTER — Encounter (INDEPENDENT_AMBULATORY_CARE_PROVIDER_SITE_OTHER): Payer: Self-pay | Admitting: Physician Assistant

## 2018-06-02 ENCOUNTER — Ambulatory Visit (INDEPENDENT_AMBULATORY_CARE_PROVIDER_SITE_OTHER): Payer: Medicare Other | Admitting: Orthopedic Surgery

## 2018-06-02 ENCOUNTER — Encounter (INDEPENDENT_AMBULATORY_CARE_PROVIDER_SITE_OTHER): Payer: Self-pay | Admitting: Orthopedic Surgery

## 2018-06-02 VITALS — Ht 63.0 in | Wt 150.0 lb

## 2018-06-02 DIAGNOSIS — M25551 Pain in right hip: Secondary | ICD-10-CM

## 2018-06-02 DIAGNOSIS — M5431 Sciatica, right side: Secondary | ICD-10-CM | POA: Diagnosis not present

## 2018-06-02 DIAGNOSIS — M7542 Impingement syndrome of left shoulder: Secondary | ICD-10-CM | POA: Diagnosis not present

## 2018-06-02 NOTE — Progress Notes (Signed)
Office Visit Note   Patient: Janet Holland           Date of Birth: 1951-02-11           MRN: [id number] Visit Date: 06/02/2018              Requested by: Chesley Noon, MD [address] [place], [place] [address] PCP: Chesley Noon, MD  Chief Complaint  Patient presents with  . Left Shoulder - Follow-up  . Right Hip - Follow-up      HPI: Patient is a 68 year old woman who presents with left shoulder pain lumbar spine pain radicular symptoms as well as right hip pain.  Patient states she is now taking the prednisone about every 3 days.  Patient states she feels like she may like to go to a chiropractor.  She states her left shoulder is doing much better.  Assessment & Plan: Visit Diagnoses:  1. Sciatica, right side   2. Impingement syndrome of left shoulder   3. Pain of right hip joint     Plan: Discussed that chiropractic manipulation can make her back feel better but it will not provide any long-term benefits.  Recommended core strengthening discontinue the prednisone use for any acute flareups of her back internal and external rotation strengthening for her shoulder.  Follow-Up Instructions: Return if symptoms worsen or fail to improve.   Ortho Exam  Patient is alert, oriented, no adenopathy, well-dressed, normal affect, normal respiratory effort. Examination patient has no limitations with range of motion of both shoulders.  She has a negative straight leg raise bilaterally.  No focal motor weakness no pain with range of motion of her hips knees or ankles.  Her radiographs were reviewed of the shoulder hip and lumbar spine.  Imaging: No results found. No images are attached to the encounter.  Labs: No results found for: HGBA1C, ESRSEDRATE, CRP, LABURIC, REPTSTATUS, GRAMSTAIN, CULT, LABORGA   No results found for: ALBUMIN, PREALBUMIN, LABURIC  Body mass index is 26.57 kg/m.  Orders:  No orders of the defined types were placed in this  encounter.  No orders of the defined types were placed in this encounter.    Procedures: No procedures performed  Clinical Data: No additional findings.  ROS:  All other systems negative, except as noted in the HPI. Review of Systems  Objective: Vital Signs: Ht 5\' 3"  (1.6 m)   Wt 150 lb (68 kg)   BMI 26.57 kg/m   Specialty Comments:  No specialty comments available.  PMFS History: Patient Active Problem List   Diagnosis Date Noted  . Lisfranc's sprain, right, initial encounter 05/01/2016  . Pain in right foot 05/01/2016  . 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 Hyperlipidemia   . Osteopenia   . Thyroid disease     Family History  Problem Relation Age of Onset  . Melanoma Brother   . Breast cancer Sister   . Ovarian cancer Maternal Grandmother   . Colon cancer Paternal Grandfather   . Heart attack Father     Past Surgical History:  Procedure Laterality Date  . APPENDECTOMY    . MYOTOMY    . RHINOPLASTY    . TONSILLECTOMY     Social History   Occupational History  . Not on file  Tobacco Use  . Smoking status: Former Smoker    Last attempt to quit: 03/30/1978    Years since quitting: 40.2  . Smokeless tobacco:  Never Used  Substance and Sexual Activity  . Alcohol use: Yes  . Drug use: No  . Sexual activity: Not on file

## 2018-07-20 DIAGNOSIS — L570 Actinic keratosis: Secondary | ICD-10-CM | POA: Diagnosis not present

## 2018-07-20 DIAGNOSIS — L814 Other melanin hyperpigmentation: Secondary | ICD-10-CM | POA: Diagnosis not present

## 2018-07-20 DIAGNOSIS — D229 Melanocytic nevi, unspecified: Secondary | ICD-10-CM | POA: Diagnosis not present

## 2018-07-20 DIAGNOSIS — D1801 Hemangioma of skin and subcutaneous tissue: Secondary | ICD-10-CM | POA: Diagnosis not present

## 2018-07-20 DIAGNOSIS — L819 Disorder of pigmentation, unspecified: Secondary | ICD-10-CM | POA: Diagnosis not present

## 2018-07-20 DIAGNOSIS — L812 Freckles: Secondary | ICD-10-CM | POA: Diagnosis not present

## 2018-07-20 DIAGNOSIS — L821 Other seborrheic keratosis: Secondary | ICD-10-CM | POA: Diagnosis not present

## 2018-08-29 DIAGNOSIS — Z01419 Encounter for gynecological examination (general) (routine) without abnormal findings: Secondary | ICD-10-CM | POA: Diagnosis not present

## 2018-08-29 DIAGNOSIS — W57XXXD Bitten or stung by nonvenomous insect and other nonvenomous arthropods, subsequent encounter: Secondary | ICD-10-CM | POA: Diagnosis not present

## 2018-08-29 DIAGNOSIS — Z6827 Body mass index (BMI) 27.0-27.9, adult: Secondary | ICD-10-CM | POA: Diagnosis not present

## 2018-08-29 DIAGNOSIS — Z1231 Encounter for screening mammogram for malignant neoplasm of breast: Secondary | ICD-10-CM | POA: Diagnosis not present

## 2018-09-09 ENCOUNTER — Other Ambulatory Visit: Payer: Self-pay | Admitting: Surgery

## 2018-09-09 DIAGNOSIS — E042 Nontoxic multinodular goiter: Secondary | ICD-10-CM

## 2018-09-23 ENCOUNTER — Ambulatory Visit
Admission: RE | Admit: 2018-09-23 | Discharge: 2018-09-23 | Disposition: A | Discharge status: Home / Self Care | Payer: Medicare Other | Source: Ambulatory Visit | Attending: Surgery | Admitting: Surgery

## 2018-09-23 DIAGNOSIS — E042 Nontoxic multinodular goiter: Secondary | ICD-10-CM | POA: Diagnosis not present

## 2018-11-21 DIAGNOSIS — M9903 Segmental and somatic dysfunction of lumbar region: Secondary | ICD-10-CM | POA: Diagnosis not present

## 2018-11-21 DIAGNOSIS — M5386 Other specified dorsopathies, lumbar region: Secondary | ICD-10-CM | POA: Diagnosis not present

## 2018-11-21 DIAGNOSIS — M9901 Segmental and somatic dysfunction of cervical region: Secondary | ICD-10-CM | POA: Diagnosis not present

## 2018-11-22 DIAGNOSIS — M5386 Other specified dorsopathies, lumbar region: Secondary | ICD-10-CM | POA: Diagnosis not present

## 2018-11-22 DIAGNOSIS — M9903 Segmental and somatic dysfunction of lumbar region: Secondary | ICD-10-CM | POA: Diagnosis not present

## 2018-11-22 DIAGNOSIS — M9901 Segmental and somatic dysfunction of cervical region: Secondary | ICD-10-CM | POA: Diagnosis not present

## 2018-11-24 DIAGNOSIS — M9901 Segmental and somatic dysfunction of cervical region: Secondary | ICD-10-CM | POA: Diagnosis not present

## 2018-11-24 DIAGNOSIS — M5386 Other specified dorsopathies, lumbar region: Secondary | ICD-10-CM | POA: Diagnosis not present

## 2018-11-24 DIAGNOSIS — M9903 Segmental and somatic dysfunction of lumbar region: Secondary | ICD-10-CM | POA: Diagnosis not present

## 2018-11-28 DIAGNOSIS — M9901 Segmental and somatic dysfunction of cervical region: Secondary | ICD-10-CM | POA: Diagnosis not present

## 2018-11-28 DIAGNOSIS — M5386 Other specified dorsopathies, lumbar region: Secondary | ICD-10-CM | POA: Diagnosis not present

## 2018-11-28 DIAGNOSIS — M9903 Segmental and somatic dysfunction of lumbar region: Secondary | ICD-10-CM | POA: Diagnosis not present

## 2018-11-28 DIAGNOSIS — M5032 Other cervical disc degeneration, mid-cervical region, unspecified level: Secondary | ICD-10-CM | POA: Diagnosis not present

## 2018-12-01 DIAGNOSIS — M9901 Segmental and somatic dysfunction of cervical region: Secondary | ICD-10-CM | POA: Diagnosis not present

## 2018-12-01 DIAGNOSIS — M5032 Other cervical disc degeneration, mid-cervical region, unspecified level: Secondary | ICD-10-CM | POA: Diagnosis not present

## 2018-12-01 DIAGNOSIS — M9903 Segmental and somatic dysfunction of lumbar region: Secondary | ICD-10-CM | POA: Diagnosis not present

## 2018-12-01 DIAGNOSIS — M5386 Other specified dorsopathies, lumbar region: Secondary | ICD-10-CM | POA: Diagnosis not present

## 2018-12-06 DIAGNOSIS — M9903 Segmental and somatic dysfunction of lumbar region: Secondary | ICD-10-CM | POA: Diagnosis not present

## 2018-12-06 DIAGNOSIS — M9901 Segmental and somatic dysfunction of cervical region: Secondary | ICD-10-CM | POA: Diagnosis not present

## 2018-12-06 DIAGNOSIS — M5386 Other specified dorsopathies, lumbar region: Secondary | ICD-10-CM | POA: Diagnosis not present

## 2018-12-06 DIAGNOSIS — M5032 Other cervical disc degeneration, mid-cervical region, unspecified level: Secondary | ICD-10-CM | POA: Diagnosis not present

## 2018-12-07 DIAGNOSIS — M9903 Segmental and somatic dysfunction of lumbar region: Secondary | ICD-10-CM | POA: Diagnosis not present

## 2018-12-07 DIAGNOSIS — M5032 Other cervical disc degeneration, mid-cervical region, unspecified level: Secondary | ICD-10-CM | POA: Diagnosis not present

## 2018-12-07 DIAGNOSIS — M5386 Other specified dorsopathies, lumbar region: Secondary | ICD-10-CM | POA: Diagnosis not present

## 2018-12-07 DIAGNOSIS — M9901 Segmental and somatic dysfunction of cervical region: Secondary | ICD-10-CM | POA: Diagnosis not present

## 2018-12-08 DIAGNOSIS — M9901 Segmental and somatic dysfunction of cervical region: Secondary | ICD-10-CM | POA: Diagnosis not present

## 2018-12-08 DIAGNOSIS — M9903 Segmental and somatic dysfunction of lumbar region: Secondary | ICD-10-CM | POA: Diagnosis not present

## 2018-12-08 DIAGNOSIS — M5386 Other specified dorsopathies, lumbar region: Secondary | ICD-10-CM | POA: Diagnosis not present

## 2018-12-08 DIAGNOSIS — M5032 Other cervical disc degeneration, mid-cervical region, unspecified level: Secondary | ICD-10-CM | POA: Diagnosis not present

## 2018-12-12 DIAGNOSIS — M5386 Other specified dorsopathies, lumbar region: Secondary | ICD-10-CM | POA: Diagnosis not present

## 2018-12-12 DIAGNOSIS — M5032 Other cervical disc degeneration, mid-cervical region, unspecified level: Secondary | ICD-10-CM | POA: Diagnosis not present

## 2018-12-12 DIAGNOSIS — M9903 Segmental and somatic dysfunction of lumbar region: Secondary | ICD-10-CM | POA: Diagnosis not present

## 2018-12-12 DIAGNOSIS — M9901 Segmental and somatic dysfunction of cervical region: Secondary | ICD-10-CM | POA: Diagnosis not present

## 2018-12-14 DIAGNOSIS — M9903 Segmental and somatic dysfunction of lumbar region: Secondary | ICD-10-CM | POA: Diagnosis not present

## 2018-12-14 DIAGNOSIS — M5032 Other cervical disc degeneration, mid-cervical region, unspecified level: Secondary | ICD-10-CM | POA: Diagnosis not present

## 2018-12-14 DIAGNOSIS — M9901 Segmental and somatic dysfunction of cervical region: Secondary | ICD-10-CM | POA: Diagnosis not present

## 2018-12-14 DIAGNOSIS — M5386 Other specified dorsopathies, lumbar region: Secondary | ICD-10-CM | POA: Diagnosis not present

## 2018-12-15 DIAGNOSIS — M5032 Other cervical disc degeneration, mid-cervical region, unspecified level: Secondary | ICD-10-CM | POA: Diagnosis not present

## 2018-12-15 DIAGNOSIS — M5386 Other specified dorsopathies, lumbar region: Secondary | ICD-10-CM | POA: Diagnosis not present

## 2018-12-15 DIAGNOSIS — M9903 Segmental and somatic dysfunction of lumbar region: Secondary | ICD-10-CM | POA: Diagnosis not present

## 2018-12-15 DIAGNOSIS — M9901 Segmental and somatic dysfunction of cervical region: Secondary | ICD-10-CM | POA: Diagnosis not present

## 2018-12-22 DIAGNOSIS — M9903 Segmental and somatic dysfunction of lumbar region: Secondary | ICD-10-CM | POA: Diagnosis not present

## 2018-12-22 DIAGNOSIS — M9901 Segmental and somatic dysfunction of cervical region: Secondary | ICD-10-CM | POA: Diagnosis not present

## 2018-12-22 DIAGNOSIS — M5386 Other specified dorsopathies, lumbar region: Secondary | ICD-10-CM | POA: Diagnosis not present

## 2018-12-22 DIAGNOSIS — M5032 Other cervical disc degeneration, mid-cervical region, unspecified level: Secondary | ICD-10-CM | POA: Diagnosis not present

## 2018-12-26 DIAGNOSIS — J301 Allergic rhinitis due to pollen: Secondary | ICD-10-CM | POA: Diagnosis not present

## 2018-12-26 DIAGNOSIS — J3089 Other allergic rhinitis: Secondary | ICD-10-CM | POA: Diagnosis not present

## 2018-12-26 DIAGNOSIS — T63421D Toxic effect of venom of ants, accidental (unintentional), subsequent encounter: Secondary | ICD-10-CM | POA: Diagnosis not present

## 2018-12-26 DIAGNOSIS — J3081 Allergic rhinitis due to animal (cat) (dog) hair and dander: Secondary | ICD-10-CM | POA: Diagnosis not present

## 2018-12-27 DIAGNOSIS — M9901 Segmental and somatic dysfunction of cervical region: Secondary | ICD-10-CM | POA: Diagnosis not present

## 2018-12-27 DIAGNOSIS — M5386 Other specified dorsopathies, lumbar region: Secondary | ICD-10-CM | POA: Diagnosis not present

## 2018-12-27 DIAGNOSIS — M9903 Segmental and somatic dysfunction of lumbar region: Secondary | ICD-10-CM | POA: Diagnosis not present

## 2018-12-27 DIAGNOSIS — M5032 Other cervical disc degeneration, mid-cervical region, unspecified level: Secondary | ICD-10-CM | POA: Diagnosis not present

## 2018-12-28 ENCOUNTER — Telehealth: Payer: Medicare Other | Admitting: Family

## 2018-12-28 DIAGNOSIS — Z20822 Contact with and (suspected) exposure to covid-19: Secondary | ICD-10-CM

## 2018-12-28 MED ORDER — BENZONATATE 100 MG PO CAPS: 100.0000 mg | ORAL_CAPSULE | Freq: Three times a day (TID) | ORAL | 0 refills | Status: DC | PRN

## 2018-12-28 NOTE — Progress Notes (Signed)
E-Visit for Corona Virus Screening   Your current symptoms could be consistent with the coronavirus.  Many health care providers can now test patients at their office but not all are.  [facility] has multiple testing sites. For information on our COVID testing locations and hours go to [email or web address]  Please quarantine yourself while awaiting your test results.  We are enrolling you in our [place] for [place] . Daily you will receive a questionnaire within the [facility] website. Our COVID 19 response team willl be monitoriing your responses daily.  Given your symptoms of cough I do recommend COVID testing. However, it could be related to your allergies and I do recommend that you continue your allergy medication. You can go to one of the  testing sites listed below, while they are opened (see hours). You do not need an order and will stay in your car during the test. You do need to self isolate until your results return and if positive 14 days from when your symptoms started and until you are 3 days symptom free.   Approximately 5 minutes was spent documenting and reviewing patient's chart.   Testing Locations (Monday - Friday, 8 a.m. - 3:30 p.m.) . [place]: [facility] at [facility], [address] [place], [place]: [place], [place], [place], [place] (entrance [address])  . [facility]: (Closed each Monday): Testing site relocated to the short stay covered drive at [facility]. (Use the [facility] entrance to [facility] next to [place] is a respiratory illness with symptoms that are similar to the flu. Symptoms are typically mild to moderate, but there have been cases of severe illness and death due to the virus. The following symptoms may appear 2-14 days after exposure: . Fever . Cough . Shortness of breath or difficulty  breathing . Chills . Repeated shaking with chills . Muscle pain . Headache . Sore throat . New loss of taste or smell . Fatigue . Congestion or runny nose . Nausea or vomiting . Diarrhea  It is vitally important that if you feel that you have an infection such as this virus or any other virus that you stay home and away from places where you may spread it to others.  You should self-quarantine for 14 days if you have symptoms that could potentially be coronavirus or have been in close contact a with a person diagnosed with COVID-19 within the last 2 weeks. You should avoid contact with people age 52 and older.   You should wear a mask or cloth face covering over your nose and mouth if you must be around other people or animals, including pets (even at home). Try to stay at least 6 feet away from other people. This will protect the people around you.  You can use medication such as A prescription cough medication called Tessalon Perles 100 mg. You may take 1-2 capsules every 8 hours as needed for cough.  You may also take acetaminophen (Tylenol) as needed for fever.   Reduce your risk of any infection by using the same precautions used for avoiding the common cold or flu:  Marland Kitchen Wash your hands often with soap and warm water for at least 20 seconds.  If soap and water are not readily available, use an alcohol-based hand sanitizer with at least 60% alcohol.  . If coughing or sneezing, cover your mouth and nose  by coughing or sneezing into the elbow areas of your shirt or coat, into a tissue or into your sleeve (not your hands). . Avoid shaking hands with others and consider head nods or verbal greetings only. . Avoid touching your eyes, nose, or mouth with unwashed hands.  . Avoid close contact with people who are sick. . Avoid places or events with large numbers of people in one location, like concerts or sporting events. . Carefully consider travel plans you have or are making. . If you are  planning any travel outside or inside the [place], visit the CDC's Travelers' Health webpage for the latest health notices. . If you have some symptoms but not all symptoms, continue to monitor at home and seek medical attention if your symptoms worsen. . If you are having a medical emergency, call 911.  HOME CARE . Only take medications as instructed by your medical team. . Drink plenty of fluids and get plenty of rest. . A steam or ultrasonic humidifier can help if you have congestion.   GET HELP RIGHT AWAY IF YOU HAVE EMERGENCY WARNING SIGNS** FOR COVID-19. If you or someone is showing any of these signs seek emergency medical care immediately. Call 911 or proceed to your closest emergency facility if: . You develop worsening high fever. . Trouble breathing . Bluish lips or face . Persistent pain or pressure in the chest . New confusion . Inability to wake or stay awake . You cough up blood. . Your symptoms become more severe  **This list is not all possible symptoms. Contact your medical provider for any symptoms that are sever or concerning to you.   MAKE SURE YOU   Understand these instructions.  Will watch your condition.  Will get help right away if you are not doing well or get worse.  Your e-visit answers were reviewed by a board certified advanced clinical practitioner to complete your personal care plan.  Depending on the condition, your plan could have included both over the counter or prescription medications.  If there is a problem please reply once you have received a response from your provider.  Your safety is important to [place].  If you have drug allergies check your prescription carefully.    You can use MyChart to ask questions about today's visit, request a non-urgent call back, or ask for a work or school excuse for 24 hours related to this e-Visit. If it has been greater than 24 hours you will need to follow up with your provider, or enter a new e-Visit to address  those concerns. You will get an e-mail in the next two days asking about your experience.  I hope that your e-visit has been valuable and will speed your recovery. Thank you for using e-visits.

## 2018-12-29 ENCOUNTER — Encounter (INDEPENDENT_AMBULATORY_CARE_PROVIDER_SITE_OTHER): Payer: Self-pay

## 2018-12-29 ENCOUNTER — Other Ambulatory Visit: Payer: Self-pay

## 2018-12-29 DIAGNOSIS — Z20822 Contact with and (suspected) exposure to covid-19: Secondary | ICD-10-CM

## 2018-12-30 ENCOUNTER — Encounter (INDEPENDENT_AMBULATORY_CARE_PROVIDER_SITE_OTHER): Payer: Self-pay

## 2018-12-30 LAB — NOVEL CORONAVIRUS, NAA: SARS-CoV-2, NAA: NOT DETECTED

## 2019-01-02 ENCOUNTER — Encounter (INDEPENDENT_AMBULATORY_CARE_PROVIDER_SITE_OTHER): Payer: Self-pay

## 2019-01-02 DIAGNOSIS — Z23 Encounter for immunization: Secondary | ICD-10-CM | POA: Diagnosis not present

## 2019-01-09 DIAGNOSIS — J3081 Allergic rhinitis due to animal (cat) (dog) hair and dander: Secondary | ICD-10-CM | POA: Diagnosis not present

## 2019-01-09 DIAGNOSIS — J301 Allergic rhinitis due to pollen: Secondary | ICD-10-CM | POA: Diagnosis not present

## 2019-01-09 DIAGNOSIS — J3089 Other allergic rhinitis: Secondary | ICD-10-CM | POA: Diagnosis not present

## 2019-01-16 DIAGNOSIS — J301 Allergic rhinitis due to pollen: Secondary | ICD-10-CM | POA: Diagnosis not present

## 2019-01-16 DIAGNOSIS — J3089 Other allergic rhinitis: Secondary | ICD-10-CM | POA: Diagnosis not present

## 2019-01-16 DIAGNOSIS — J3081 Allergic rhinitis due to animal (cat) (dog) hair and dander: Secondary | ICD-10-CM | POA: Diagnosis not present

## 2019-01-17 DIAGNOSIS — Z20828 Contact with and (suspected) exposure to other viral communicable diseases: Secondary | ICD-10-CM | POA: Diagnosis not present

## 2019-01-20 DIAGNOSIS — J3089 Other allergic rhinitis: Secondary | ICD-10-CM | POA: Diagnosis not present

## 2019-01-20 DIAGNOSIS — J3081 Allergic rhinitis due to animal (cat) (dog) hair and dander: Secondary | ICD-10-CM | POA: Diagnosis not present

## 2019-01-20 DIAGNOSIS — J301 Allergic rhinitis due to pollen: Secondary | ICD-10-CM | POA: Diagnosis not present

## 2019-01-24 DIAGNOSIS — J3081 Allergic rhinitis due to animal (cat) (dog) hair and dander: Secondary | ICD-10-CM | POA: Diagnosis not present

## 2019-01-24 DIAGNOSIS — J301 Allergic rhinitis due to pollen: Secondary | ICD-10-CM | POA: Diagnosis not present

## 2019-01-24 DIAGNOSIS — J3089 Other allergic rhinitis: Secondary | ICD-10-CM | POA: Diagnosis not present

## 2019-01-27 DIAGNOSIS — J3081 Allergic rhinitis due to animal (cat) (dog) hair and dander: Secondary | ICD-10-CM | POA: Diagnosis not present

## 2019-01-27 DIAGNOSIS — J3089 Other allergic rhinitis: Secondary | ICD-10-CM | POA: Diagnosis not present

## 2019-01-27 DIAGNOSIS — J301 Allergic rhinitis due to pollen: Secondary | ICD-10-CM | POA: Diagnosis not present

## 2019-01-31 DIAGNOSIS — J301 Allergic rhinitis due to pollen: Secondary | ICD-10-CM | POA: Diagnosis not present

## 2019-01-31 DIAGNOSIS — J3081 Allergic rhinitis due to animal (cat) (dog) hair and dander: Secondary | ICD-10-CM | POA: Diagnosis not present

## 2019-01-31 DIAGNOSIS — J3089 Other allergic rhinitis: Secondary | ICD-10-CM | POA: Diagnosis not present

## 2019-02-03 DIAGNOSIS — J3081 Allergic rhinitis due to animal (cat) (dog) hair and dander: Secondary | ICD-10-CM | POA: Diagnosis not present

## 2019-02-03 DIAGNOSIS — J301 Allergic rhinitis due to pollen: Secondary | ICD-10-CM | POA: Diagnosis not present

## 2019-02-03 DIAGNOSIS — J3089 Other allergic rhinitis: Secondary | ICD-10-CM | POA: Diagnosis not present

## 2019-02-06 DIAGNOSIS — J301 Allergic rhinitis due to pollen: Secondary | ICD-10-CM | POA: Diagnosis not present

## 2019-02-06 DIAGNOSIS — J3089 Other allergic rhinitis: Secondary | ICD-10-CM | POA: Diagnosis not present

## 2019-02-06 DIAGNOSIS — J3081 Allergic rhinitis due to animal (cat) (dog) hair and dander: Secondary | ICD-10-CM | POA: Diagnosis not present

## 2019-02-10 DIAGNOSIS — J3081 Allergic rhinitis due to animal (cat) (dog) hair and dander: Secondary | ICD-10-CM | POA: Diagnosis not present

## 2019-02-10 DIAGNOSIS — J3089 Other allergic rhinitis: Secondary | ICD-10-CM | POA: Diagnosis not present

## 2019-02-10 DIAGNOSIS — J301 Allergic rhinitis due to pollen: Secondary | ICD-10-CM | POA: Diagnosis not present

## 2019-02-14 DIAGNOSIS — J3089 Other allergic rhinitis: Secondary | ICD-10-CM | POA: Diagnosis not present

## 2019-02-14 DIAGNOSIS — J301 Allergic rhinitis due to pollen: Secondary | ICD-10-CM | POA: Diagnosis not present

## 2019-02-14 DIAGNOSIS — J3081 Allergic rhinitis due to animal (cat) (dog) hair and dander: Secondary | ICD-10-CM | POA: Diagnosis not present

## 2019-02-16 DIAGNOSIS — J3081 Allergic rhinitis due to animal (cat) (dog) hair and dander: Secondary | ICD-10-CM | POA: Diagnosis not present

## 2019-02-16 DIAGNOSIS — J301 Allergic rhinitis due to pollen: Secondary | ICD-10-CM | POA: Diagnosis not present

## 2019-02-16 DIAGNOSIS — J3089 Other allergic rhinitis: Secondary | ICD-10-CM | POA: Diagnosis not present

## 2019-02-20 DIAGNOSIS — J3081 Allergic rhinitis due to animal (cat) (dog) hair and dander: Secondary | ICD-10-CM | POA: Diagnosis not present

## 2019-02-20 DIAGNOSIS — J3089 Other allergic rhinitis: Secondary | ICD-10-CM | POA: Diagnosis not present

## 2019-02-20 DIAGNOSIS — J301 Allergic rhinitis due to pollen: Secondary | ICD-10-CM | POA: Diagnosis not present

## 2019-02-22 ENCOUNTER — Other Ambulatory Visit: Payer: Self-pay

## 2019-02-22 DIAGNOSIS — J3081 Allergic rhinitis due to animal (cat) (dog) hair and dander: Secondary | ICD-10-CM | POA: Diagnosis not present

## 2019-02-22 DIAGNOSIS — J3089 Other allergic rhinitis: Secondary | ICD-10-CM | POA: Diagnosis not present

## 2019-02-22 DIAGNOSIS — J301 Allergic rhinitis due to pollen: Secondary | ICD-10-CM | POA: Diagnosis not present

## 2019-02-28 DIAGNOSIS — J3089 Other allergic rhinitis: Secondary | ICD-10-CM | POA: Diagnosis not present

## 2019-02-28 DIAGNOSIS — J3081 Allergic rhinitis due to animal (cat) (dog) hair and dander: Secondary | ICD-10-CM | POA: Diagnosis not present

## 2019-02-28 DIAGNOSIS — J301 Allergic rhinitis due to pollen: Secondary | ICD-10-CM | POA: Diagnosis not present

## 2019-03-03 DIAGNOSIS — J3081 Allergic rhinitis due to animal (cat) (dog) hair and dander: Secondary | ICD-10-CM | POA: Diagnosis not present

## 2019-03-03 DIAGNOSIS — J301 Allergic rhinitis due to pollen: Secondary | ICD-10-CM | POA: Diagnosis not present

## 2019-03-03 DIAGNOSIS — J3089 Other allergic rhinitis: Secondary | ICD-10-CM | POA: Diagnosis not present

## 2019-03-06 DIAGNOSIS — J3081 Allergic rhinitis due to animal (cat) (dog) hair and dander: Secondary | ICD-10-CM | POA: Diagnosis not present

## 2019-03-06 DIAGNOSIS — J3089 Other allergic rhinitis: Secondary | ICD-10-CM | POA: Diagnosis not present

## 2019-03-06 DIAGNOSIS — J301 Allergic rhinitis due to pollen: Secondary | ICD-10-CM | POA: Diagnosis not present

## 2019-03-07 IMAGING — US US FNA BIOPSY THYROID 1ST LESION
1 series · 13 of 15 positions shown · non-contrast
Comparison: Thyroid ultrasound performed 07/30/2017.

MEDICATIONS:
None

COMPLICATIONS:
None immediate.

INDICATION: Indeterminate right mid thyroid nodule

EXAM:
ULTRASOUND GUIDED FINE NEEDLE ASPIRATION OF INDETERMINATE THYROID
NODULE
TECHNIQUE: Informed written consent was obtained from the patient after a
discussion of the risks, benefits and alternatives to treatment.
Questions regarding the procedure were encouraged and answered. A
timeout was performed prior to the initiation of the procedure.

[Series 1: us fna biopsy thyroid 1st lesion · 0.06mm/px · 15 acquisitions, 13 frames shown]
[im 1/15]
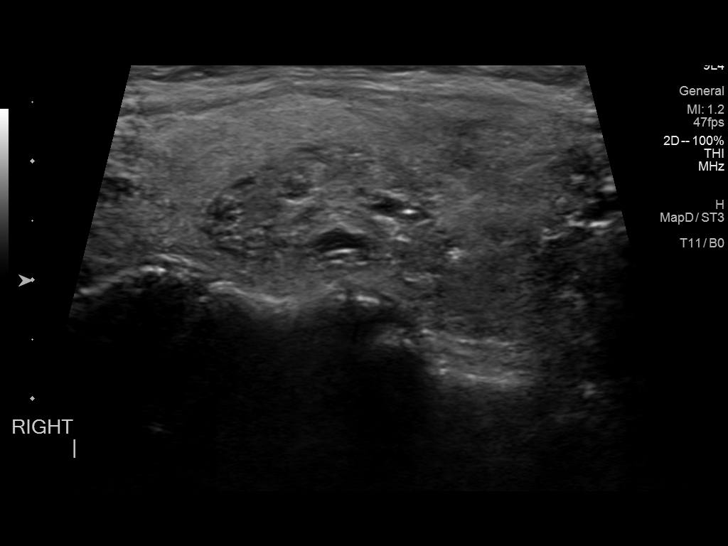
[im 2/15]
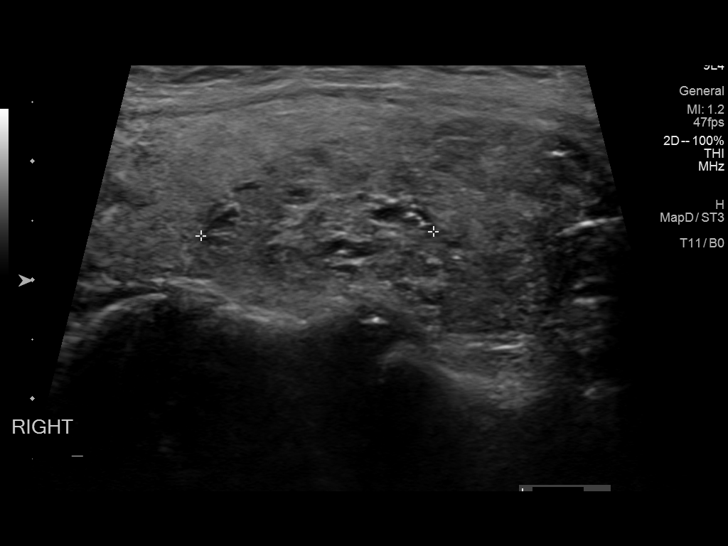
[im 3/15]
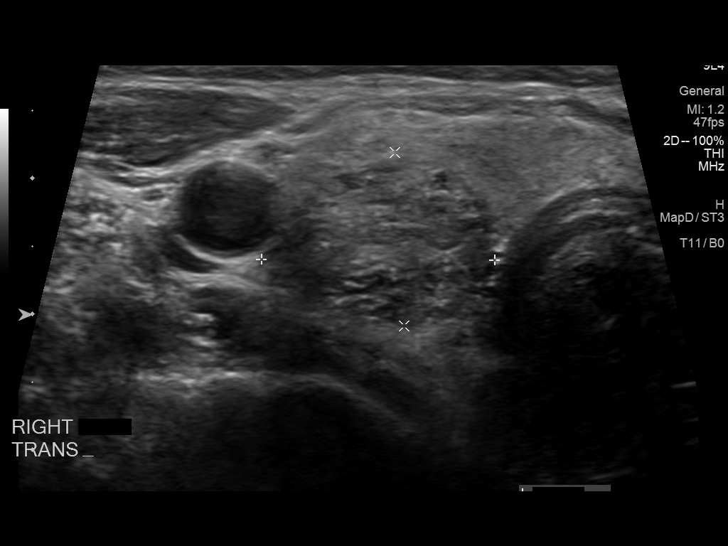
[im 5/15]
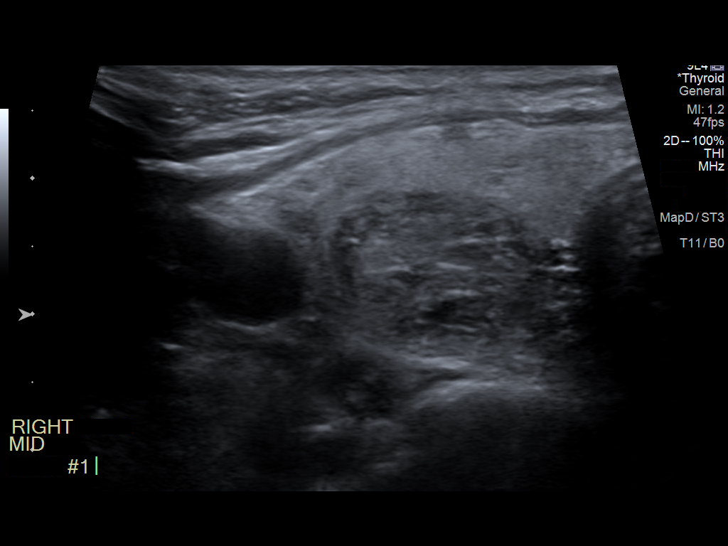
[im 6/15]
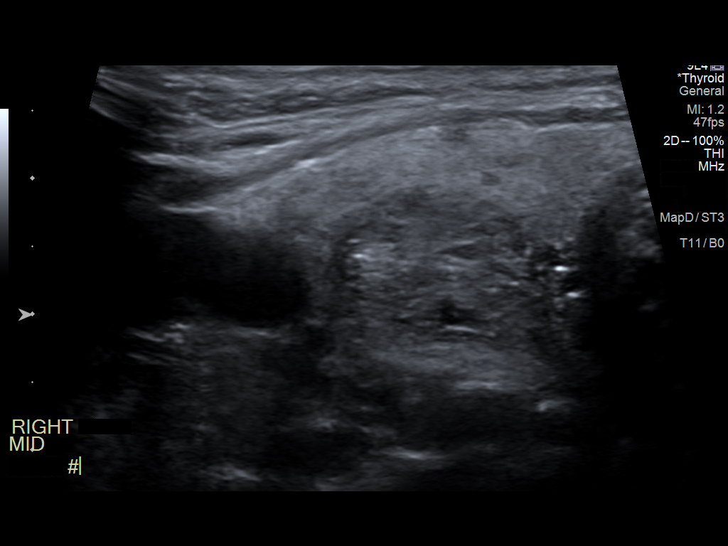
[im 7/15]
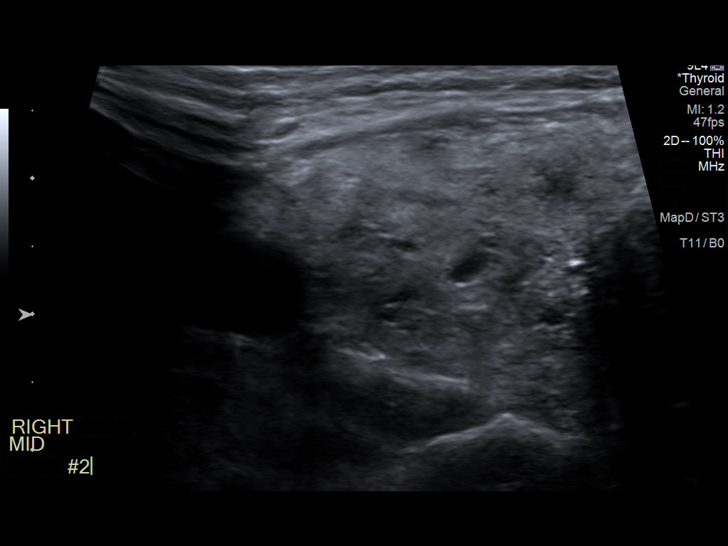
[im 8/15]
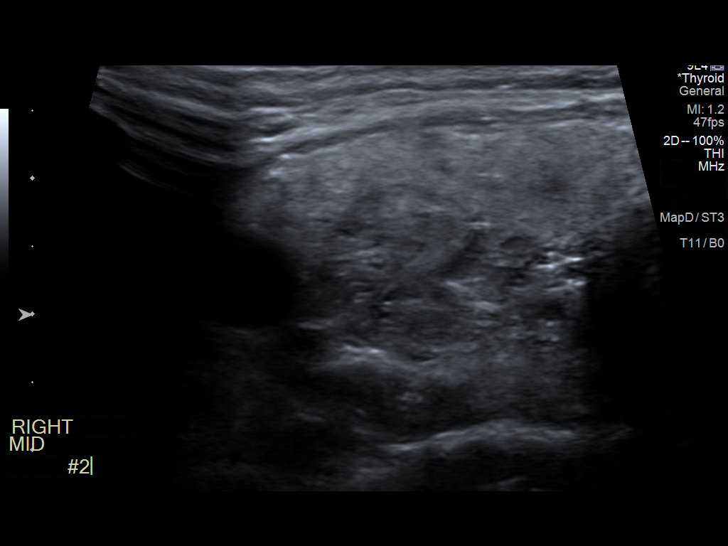
[im 9/15]
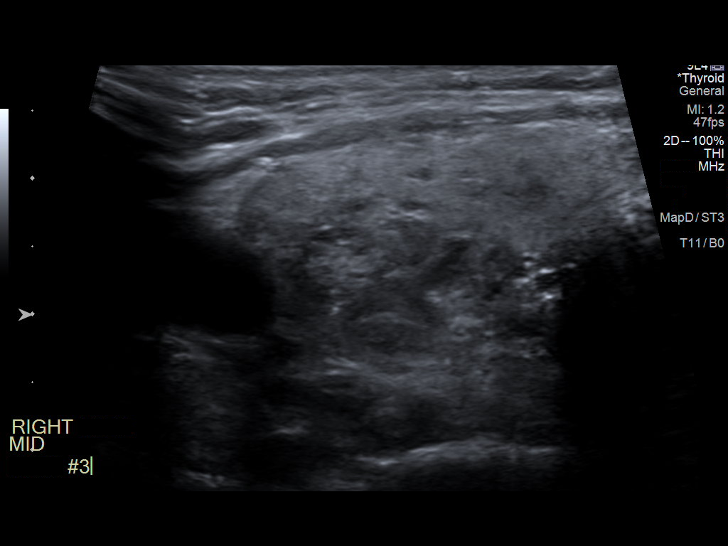
[im 10/15]
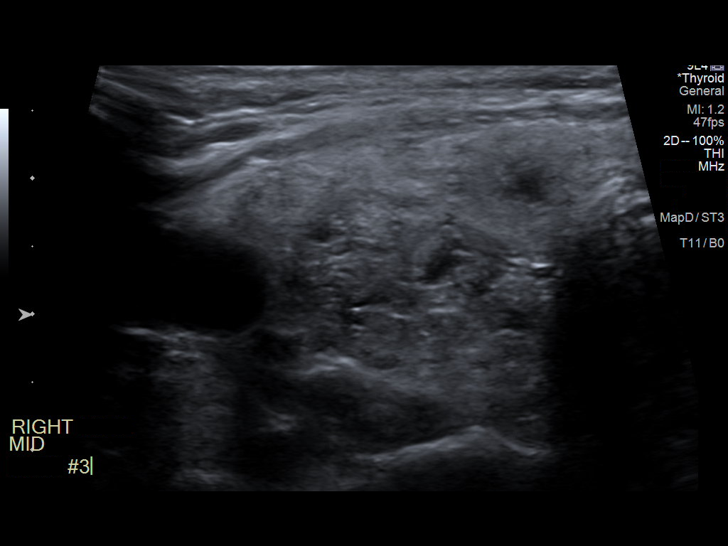
[im 11/15]
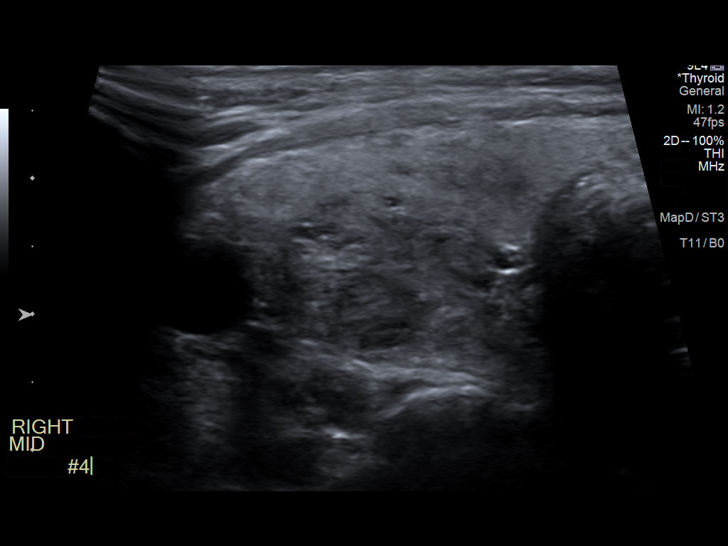
[im 13/15]
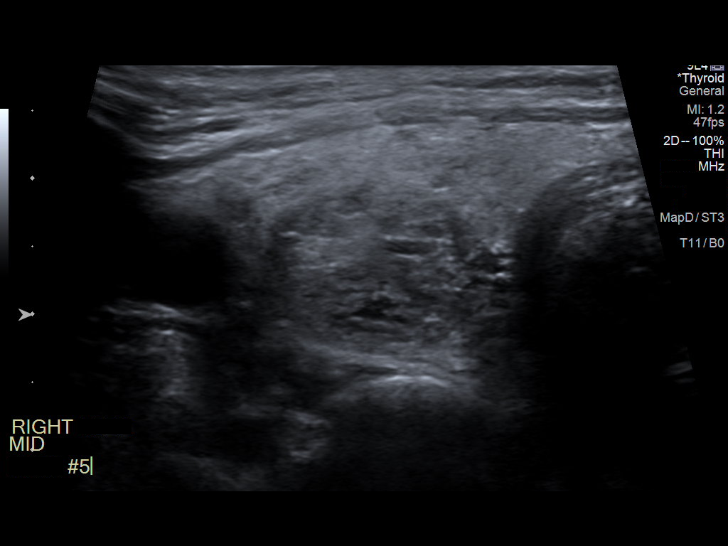
[im 14/15]
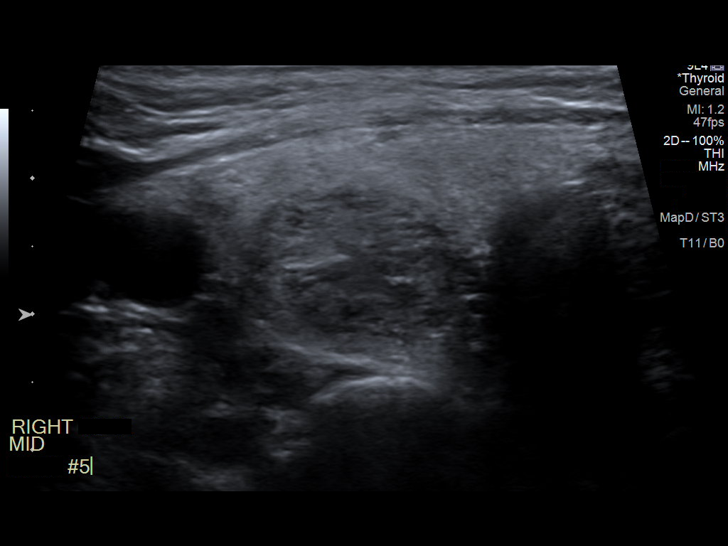
[im 15/15]
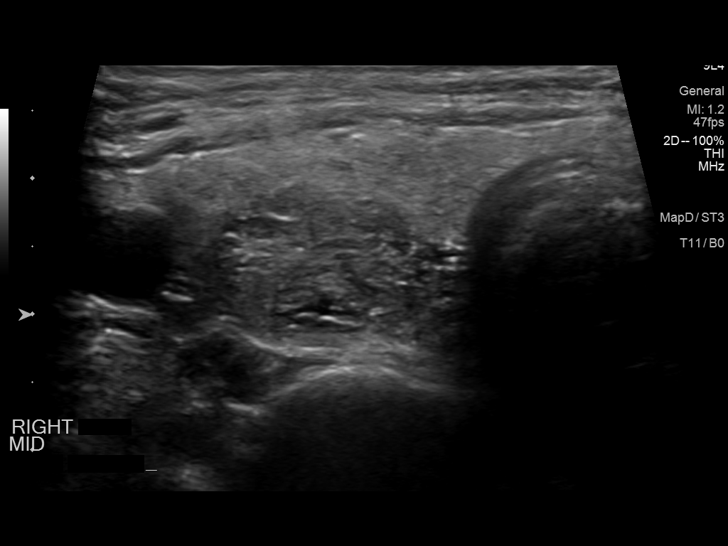

[13 of 15 positions shown; findings below may reference images not displayed]

Pre-procedural ultrasound scanning demonstrated unchanged size and
appearance of the indeterminate nodule within the right mid thyroid
lobe.

The procedure was planned. The neck was prepped in the usual sterile
fashion, and a sterile drape was applied covering the operative
field. A timeout was performed prior to the initiation of the
procedure. Local anesthesia was provided with 1% lidocaine.

Under direct ultrasound guidance, 5 FNA biopsies were performed of
the right mid thyroid nodule with a 25 gauge needle. Multiple
ultrasound images were saved for procedural documentation purposes.
The samples were prepared and submitted to pathology including 2
separately for Afirma testing.

Limited post procedural scanning was negative for hematoma or
additional complication. Dressings were placed. The patient
tolerated the above procedures procedure well without immediate
postprocedural complication.
FINDINGS: FINDINGS
Nodule reference number based on prior diagnostic ultrasound: 1

Maximum size: 1.9 cm

Location: Right  ;  Mid

ACR TI-RADS total points: 4

ACR TI-RADS risk category:  TR4 (4-6 points)

Prior biopsy:  No

Reason for biopsy: meets ACR TI-RADS criteria

Ultrasound imaging confirms appropriate placement of the needles
within the thyroid nodule.
IMPRESSION: Technically successful ultrasound guided fine needle aspiration of
right mid thyroid lobe nodule as described above.

## 2019-04-11 DIAGNOSIS — J301 Allergic rhinitis due to pollen: Secondary | ICD-10-CM | POA: Diagnosis not present

## 2019-04-11 DIAGNOSIS — J3089 Other allergic rhinitis: Secondary | ICD-10-CM | POA: Diagnosis not present

## 2019-04-11 DIAGNOSIS — J3081 Allergic rhinitis due to animal (cat) (dog) hair and dander: Secondary | ICD-10-CM | POA: Diagnosis not present

## 2019-04-14 DIAGNOSIS — J3081 Allergic rhinitis due to animal (cat) (dog) hair and dander: Secondary | ICD-10-CM | POA: Diagnosis not present

## 2019-04-14 DIAGNOSIS — J3089 Other allergic rhinitis: Secondary | ICD-10-CM | POA: Diagnosis not present

## 2019-04-14 DIAGNOSIS — J301 Allergic rhinitis due to pollen: Secondary | ICD-10-CM | POA: Diagnosis not present

## 2019-04-18 DIAGNOSIS — J3081 Allergic rhinitis due to animal (cat) (dog) hair and dander: Secondary | ICD-10-CM | POA: Diagnosis not present

## 2019-04-18 DIAGNOSIS — J301 Allergic rhinitis due to pollen: Secondary | ICD-10-CM | POA: Diagnosis not present

## 2019-04-18 DIAGNOSIS — J3089 Other allergic rhinitis: Secondary | ICD-10-CM | POA: Diagnosis not present

## 2019-04-21 ENCOUNTER — Ambulatory Visit: Payer: Medicare Other | Attending: Internal Medicine

## 2019-04-21 DIAGNOSIS — Z23 Encounter for immunization: Secondary | ICD-10-CM | POA: Insufficient documentation

## 2019-04-21 NOTE — Progress Notes (Signed)
   Covid-19 Vaccination Clinic  Name:  Janet Holland    MRN: [id number] DOB: 19-Jul-1950  04/21/2019  [name] was observed post Covid-19 immunization for 15 minutes without incidence. She was provided with Vaccine Information Sheet and instruction to access the V-Safe system.   [name] was instructed to call 911 with any severe reactions post vaccine: Marland Kitchen Difficulty breathing  . Swelling of your face and throat  . A fast heartbeat  . A bad rash all over your body  . Dizziness and weakness    Immunizations Administered    Name Date Dose VIS Date Route   Pfizer COVID-19 Vaccine 04/21/2019 10:59 AM 0.3 mL 03/10/2019 Intramuscular   Manufacturer: [place]   Lot: [id number]   [place]: [id number]

## 2019-04-25 DIAGNOSIS — J301 Allergic rhinitis due to pollen: Secondary | ICD-10-CM | POA: Diagnosis not present

## 2019-04-25 DIAGNOSIS — J3089 Other allergic rhinitis: Secondary | ICD-10-CM | POA: Diagnosis not present

## 2019-04-25 DIAGNOSIS — J3081 Allergic rhinitis due to animal (cat) (dog) hair and dander: Secondary | ICD-10-CM | POA: Diagnosis not present

## 2019-04-27 DIAGNOSIS — J3081 Allergic rhinitis due to animal (cat) (dog) hair and dander: Secondary | ICD-10-CM | POA: Diagnosis not present

## 2019-04-27 DIAGNOSIS — J301 Allergic rhinitis due to pollen: Secondary | ICD-10-CM | POA: Diagnosis not present

## 2019-05-05 DIAGNOSIS — J3089 Other allergic rhinitis: Secondary | ICD-10-CM | POA: Diagnosis not present

## 2019-05-05 DIAGNOSIS — J3081 Allergic rhinitis due to animal (cat) (dog) hair and dander: Secondary | ICD-10-CM | POA: Diagnosis not present

## 2019-05-05 DIAGNOSIS — J301 Allergic rhinitis due to pollen: Secondary | ICD-10-CM | POA: Diagnosis not present

## 2019-05-09 DIAGNOSIS — J3081 Allergic rhinitis due to animal (cat) (dog) hair and dander: Secondary | ICD-10-CM | POA: Diagnosis not present

## 2019-05-09 DIAGNOSIS — J3089 Other allergic rhinitis: Secondary | ICD-10-CM | POA: Diagnosis not present

## 2019-05-09 DIAGNOSIS — J301 Allergic rhinitis due to pollen: Secondary | ICD-10-CM | POA: Diagnosis not present

## 2019-05-11 DIAGNOSIS — J301 Allergic rhinitis due to pollen: Secondary | ICD-10-CM | POA: Diagnosis not present

## 2019-05-11 DIAGNOSIS — J3081 Allergic rhinitis due to animal (cat) (dog) hair and dander: Secondary | ICD-10-CM | POA: Diagnosis not present

## 2019-05-12 ENCOUNTER — Ambulatory Visit: Payer: Medicare Other | Attending: Internal Medicine

## 2019-05-12 DIAGNOSIS — Z23 Encounter for immunization: Secondary | ICD-10-CM | POA: Insufficient documentation

## 2019-05-12 NOTE — Progress Notes (Signed)
   Covid-19 Vaccination Clinic  Name:  Janet Holland    MRN: [id number] DOB: [birth year]  05/12/2019  Ms. Sadowski was observed post Covid-19 immunization for 30 minutes based on pre-vaccination screening without incidence. She was provided with Vaccine Information Sheet and instruction to access the V-Safe system.   Ms. Milford was instructed to call 911 with any severe reactions post vaccine: Marland Kitchen Difficulty breathing  . Swelling of your face and throat  . A fast heartbeat  . A bad rash all over your body  . Dizziness and weakness    Immunizations Administered    Name Date Dose VIS Date Route   Pfizer COVID-19 Vaccine 05/12/2019  2:52 PM 0.3 mL 03/10/2019 Intramuscular   Manufacturer: [place]   Lot: [id number]   [place]: [id number]

## 2019-05-16 DIAGNOSIS — J3081 Allergic rhinitis due to animal (cat) (dog) hair and dander: Secondary | ICD-10-CM | POA: Diagnosis not present

## 2019-05-16 DIAGNOSIS — J3089 Other allergic rhinitis: Secondary | ICD-10-CM | POA: Diagnosis not present

## 2019-05-16 DIAGNOSIS — J301 Allergic rhinitis due to pollen: Secondary | ICD-10-CM | POA: Diagnosis not present

## 2019-05-19 DIAGNOSIS — J3089 Other allergic rhinitis: Secondary | ICD-10-CM | POA: Diagnosis not present

## 2019-05-19 DIAGNOSIS — J3081 Allergic rhinitis due to animal (cat) (dog) hair and dander: Secondary | ICD-10-CM | POA: Diagnosis not present

## 2019-05-19 DIAGNOSIS — J301 Allergic rhinitis due to pollen: Secondary | ICD-10-CM | POA: Diagnosis not present

## 2019-05-19 DIAGNOSIS — T63421A Toxic effect of venom of ants, accidental (unintentional), initial encounter: Secondary | ICD-10-CM | POA: Diagnosis not present

## 2019-05-22 DIAGNOSIS — J3089 Other allergic rhinitis: Secondary | ICD-10-CM | POA: Diagnosis not present

## 2019-05-22 DIAGNOSIS — J3081 Allergic rhinitis due to animal (cat) (dog) hair and dander: Secondary | ICD-10-CM | POA: Diagnosis not present

## 2019-05-22 DIAGNOSIS — J301 Allergic rhinitis due to pollen: Secondary | ICD-10-CM | POA: Diagnosis not present

## 2019-05-30 DIAGNOSIS — J3089 Other allergic rhinitis: Secondary | ICD-10-CM | POA: Diagnosis not present

## 2019-05-30 DIAGNOSIS — J301 Allergic rhinitis due to pollen: Secondary | ICD-10-CM | POA: Diagnosis not present

## 2019-05-30 DIAGNOSIS — J3081 Allergic rhinitis due to animal (cat) (dog) hair and dander: Secondary | ICD-10-CM | POA: Diagnosis not present

## 2019-06-06 DIAGNOSIS — J301 Allergic rhinitis due to pollen: Secondary | ICD-10-CM | POA: Diagnosis not present

## 2019-06-06 DIAGNOSIS — J3089 Other allergic rhinitis: Secondary | ICD-10-CM | POA: Diagnosis not present

## 2019-06-06 DIAGNOSIS — J3081 Allergic rhinitis due to animal (cat) (dog) hair and dander: Secondary | ICD-10-CM | POA: Diagnosis not present

## 2019-06-12 DIAGNOSIS — J3089 Other allergic rhinitis: Secondary | ICD-10-CM | POA: Diagnosis not present

## 2019-06-12 DIAGNOSIS — J301 Allergic rhinitis due to pollen: Secondary | ICD-10-CM | POA: Diagnosis not present

## 2019-06-12 DIAGNOSIS — J3081 Allergic rhinitis due to animal (cat) (dog) hair and dander: Secondary | ICD-10-CM | POA: Diagnosis not present

## 2019-06-19 DIAGNOSIS — J301 Allergic rhinitis due to pollen: Secondary | ICD-10-CM | POA: Diagnosis not present

## 2019-06-19 DIAGNOSIS — J3089 Other allergic rhinitis: Secondary | ICD-10-CM | POA: Diagnosis not present

## 2019-06-19 DIAGNOSIS — J3081 Allergic rhinitis due to animal (cat) (dog) hair and dander: Secondary | ICD-10-CM | POA: Diagnosis not present

## 2019-06-26 DIAGNOSIS — T63421D Toxic effect of venom of ants, accidental (unintentional), subsequent encounter: Secondary | ICD-10-CM | POA: Diagnosis not present

## 2019-06-26 DIAGNOSIS — J301 Allergic rhinitis due to pollen: Secondary | ICD-10-CM | POA: Diagnosis not present

## 2019-06-26 DIAGNOSIS — J3089 Other allergic rhinitis: Secondary | ICD-10-CM | POA: Diagnosis not present

## 2019-06-26 DIAGNOSIS — J3081 Allergic rhinitis due to animal (cat) (dog) hair and dander: Secondary | ICD-10-CM | POA: Diagnosis not present

## 2019-07-03 DIAGNOSIS — J3081 Allergic rhinitis due to animal (cat) (dog) hair and dander: Secondary | ICD-10-CM | POA: Diagnosis not present

## 2019-07-03 DIAGNOSIS — T63421D Toxic effect of venom of ants, accidental (unintentional), subsequent encounter: Secondary | ICD-10-CM | POA: Diagnosis not present

## 2019-07-03 DIAGNOSIS — J301 Allergic rhinitis due to pollen: Secondary | ICD-10-CM | POA: Diagnosis not present

## 2019-07-03 DIAGNOSIS — J3089 Other allergic rhinitis: Secondary | ICD-10-CM | POA: Diagnosis not present

## 2019-07-10 DIAGNOSIS — J3089 Other allergic rhinitis: Secondary | ICD-10-CM | POA: Diagnosis not present

## 2019-07-10 DIAGNOSIS — J301 Allergic rhinitis due to pollen: Secondary | ICD-10-CM | POA: Diagnosis not present

## 2019-07-10 DIAGNOSIS — J3081 Allergic rhinitis due to animal (cat) (dog) hair and dander: Secondary | ICD-10-CM | POA: Diagnosis not present

## 2019-07-17 DIAGNOSIS — J301 Allergic rhinitis due to pollen: Secondary | ICD-10-CM | POA: Diagnosis not present

## 2019-07-17 DIAGNOSIS — J3089 Other allergic rhinitis: Secondary | ICD-10-CM | POA: Diagnosis not present

## 2019-07-17 DIAGNOSIS — J3081 Allergic rhinitis due to animal (cat) (dog) hair and dander: Secondary | ICD-10-CM | POA: Diagnosis not present

## 2019-07-24 DIAGNOSIS — T63421D Toxic effect of venom of ants, accidental (unintentional), subsequent encounter: Secondary | ICD-10-CM | POA: Diagnosis not present

## 2019-07-24 DIAGNOSIS — J3081 Allergic rhinitis due to animal (cat) (dog) hair and dander: Secondary | ICD-10-CM | POA: Diagnosis not present

## 2019-07-24 DIAGNOSIS — J3089 Other allergic rhinitis: Secondary | ICD-10-CM | POA: Diagnosis not present

## 2019-07-24 DIAGNOSIS — J301 Allergic rhinitis due to pollen: Secondary | ICD-10-CM | POA: Diagnosis not present

## 2019-07-31 DIAGNOSIS — J3089 Other allergic rhinitis: Secondary | ICD-10-CM | POA: Diagnosis not present

## 2019-07-31 DIAGNOSIS — J301 Allergic rhinitis due to pollen: Secondary | ICD-10-CM | POA: Diagnosis not present

## 2019-07-31 DIAGNOSIS — J3081 Allergic rhinitis due to animal (cat) (dog) hair and dander: Secondary | ICD-10-CM | POA: Diagnosis not present

## 2019-08-07 DIAGNOSIS — J3081 Allergic rhinitis due to animal (cat) (dog) hair and dander: Secondary | ICD-10-CM | POA: Diagnosis not present

## 2019-08-07 DIAGNOSIS — J301 Allergic rhinitis due to pollen: Secondary | ICD-10-CM | POA: Diagnosis not present

## 2019-08-07 DIAGNOSIS — J3089 Other allergic rhinitis: Secondary | ICD-10-CM | POA: Diagnosis not present

## 2019-08-14 DIAGNOSIS — J3089 Other allergic rhinitis: Secondary | ICD-10-CM | POA: Diagnosis not present

## 2019-08-14 DIAGNOSIS — J301 Allergic rhinitis due to pollen: Secondary | ICD-10-CM | POA: Diagnosis not present

## 2019-08-14 DIAGNOSIS — T63421D Toxic effect of venom of ants, accidental (unintentional), subsequent encounter: Secondary | ICD-10-CM | POA: Diagnosis not present

## 2019-08-14 DIAGNOSIS — J3081 Allergic rhinitis due to animal (cat) (dog) hair and dander: Secondary | ICD-10-CM | POA: Diagnosis not present

## 2019-08-17 DIAGNOSIS — D229 Melanocytic nevi, unspecified: Secondary | ICD-10-CM | POA: Diagnosis not present

## 2019-08-17 DIAGNOSIS — L812 Freckles: Secondary | ICD-10-CM | POA: Diagnosis not present

## 2019-08-17 DIAGNOSIS — L820 Inflamed seborrheic keratosis: Secondary | ICD-10-CM | POA: Diagnosis not present

## 2019-08-17 DIAGNOSIS — I8393 Asymptomatic varicose veins of bilateral lower extremities: Secondary | ICD-10-CM | POA: Diagnosis not present

## 2019-08-17 DIAGNOSIS — L814 Other melanin hyperpigmentation: Secondary | ICD-10-CM | POA: Diagnosis not present

## 2019-08-17 DIAGNOSIS — L821 Other seborrheic keratosis: Secondary | ICD-10-CM | POA: Diagnosis not present

## 2019-08-17 DIAGNOSIS — D1801 Hemangioma of skin and subcutaneous tissue: Secondary | ICD-10-CM | POA: Diagnosis not present

## 2019-08-17 DIAGNOSIS — L819 Disorder of pigmentation, unspecified: Secondary | ICD-10-CM | POA: Diagnosis not present

## 2019-08-20 ENCOUNTER — Other Ambulatory Visit: Payer: Self-pay

## 2019-08-20 ENCOUNTER — Ambulatory Visit
Admission: EM | Admit: 2019-08-20 | Discharge: 2019-08-20 | Disposition: A | Discharge status: Home / Self Care | Payer: Medicare Other | Attending: Emergency Medicine | Admitting: Emergency Medicine

## 2019-08-20 DIAGNOSIS — W57XXXA Bitten or stung by nonvenomous insect and other nonvenomous arthropods, initial encounter: Secondary | ICD-10-CM

## 2019-08-20 DIAGNOSIS — R21 Rash and other nonspecific skin eruption: Secondary | ICD-10-CM | POA: Diagnosis not present

## 2019-08-20 DIAGNOSIS — S20161A Insect bite (nonvenomous) of breast, right breast, initial encounter: Secondary | ICD-10-CM | POA: Diagnosis not present

## 2019-08-20 MED ORDER — DOXYCYCLINE HYCLATE 100 MG PO CAPS: 100.0000 mg | ORAL_CAPSULE | Freq: Two times a day (BID) | ORAL | 0 refills | Status: DC

## 2019-08-20 MED ORDER — DOXYCYCLINE HYCLATE 100 MG PO CAPS
100.0000 mg | ORAL_CAPSULE | Freq: Two times a day (BID) | ORAL | 0 refills | Status: DC
Start: 2019-08-20 — End: 2023-06-25

## 2019-08-20 NOTE — ED Triage Notes (Signed)
Seen by provider

## 2019-08-20 NOTE — Discharge Instructions (Addendum)
Prescribed 10 days course of doxycycline. To prevent tick bites, wear long sleeves, long pants, and light colors. Use DEET insect repellent. Follow the instructions on the bottle. If the tick is biting, do not try to remove it with heat, alcohol, petroleum jelly, or fingernail polish. Use tweezers, curved forceps, or a tick-removal tool to grasp the tick. Gently pull up until the tick lets go. Do not twist or jerk the tick. Do not squeeze or crush the tick. Return here or go to ER if you have any new or worsening symptoms (rash, nausea, vomiting, fever, chills, headache, fatigue)

## 2019-08-20 NOTE — ED Provider Notes (Addendum)
[facility]    CSN: [id number] Arrival date & time: 08/20/19  [id number]      History   Chief Complaint Tick bite  HPI Janet Holland is a 69 y.o. female.   Who presented to the urgent care for complaint of tick bite for the past 2 days.  Denies a precipitating event, noticed while changing clothes.  Localizes the bite to her right breast.  Has removed to tick.  Denies previous hx of tick bite.  Denies fever, chills, nausea, vomiting, headache, dizziness, weakness, fatigue, rash, or abdominal pain.   The history is provided by the patient. No language interpreter was used.    Past Medical History:  Diagnosis Date  . Hyperlipidemia   . Osteopenia   . Thyroid disease     Patient Active Problem List   Diagnosis Date Noted  . Lisfranc's sprain, right, initial encounter 05/01/2016  . Pain in right foot 05/01/2016  . 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 APPENDECTOMY    . MYOTOMY    . RHINOPLASTY    . TONSILLECTOMY      OB History   No obstetric history on file.      Home Medications    Prior to Admission medications   Medication Sig Start Date End Date Taking? Authorizing Provider  benzonatate (TESSALON PERLES) 100 MG capsule Take 1 capsule (100 mg total) by mouth 3 (three) times daily as needed. 12/28/18   Sharion Balloon, FNP  cetirizine (ZYRTEC) 5 MG tablet Take 5 mg by mouth daily.    [provider]  Cholecalciferol (VITAMIN D3) 10000 units TABS Take 1,000 Units by mouth 3 (three) times a week.     [provider]  cycloSPORINE (RESTASIS) 0.05 % ophthalmic emulsion 1 drop 2 (two) times daily.    [provider]  doxycycline (VIBRAMYCIN) 100 MG capsule Take 1 capsule (100 mg total) by mouth 2 (two) times daily. 08/20/19   Aahana Elza, Darrelyn Hillock, FNP  Multiple Minerals-Vitamins (CALCIUM & VIT D3 BONE HEALTH PO) Take by mouth.    [provider]  predniSONE  (DELTASONE) 10 MG tablet Take 2 tablets (20 mg total) by mouth daily with breakfast. 05/02/18   Rayburn, Neta Mends, PA-C    Family History Family History  Problem Relation Age of Onset  . Melanoma Brother   . Breast cancer Sister   . Ovarian cancer Maternal Grandmother   . Colon cancer Paternal Grandfather   . Heart attack Father     Social History Social History   Tobacco Use  . Smoking status: Former Smoker    Quit date: 03/30/1978    Years since quitting: 41.4  . Smokeless tobacco: Never Used  Substance Use Topics  . Alcohol use: Yes  . Drug use: No     Allergies   Patient has no known allergies.   Review of Systems Review of Systems  Constitutional: Negative.   Respiratory: Negative.   Cardiovascular: Negative.   Skin: Positive for color change.  All other systems reviewed and are negative.    Physical Exam Triage Vital Signs ED Triage Vitals [08/20/19 0923]  Enc Vitals Group     BP (!) 146/78     Pulse Rate 75     Resp 16     Temp 98.2 F (36.8 C)     Temp Source Oral     SpO2 98 %     Weight  Height      Head Circumference      Peak Flow      Pain Score      Pain Loc      Pain Edu?      Excl. in [place]?    No data found.  Updated Vital Signs BP (!) 146/78 (BP Location: Right Arm)   Pulse 75   Temp 98.2 F (36.8 C) (Oral)   Resp 16   SpO2 98%   Visual Acuity Right Eye Distance:   Left Eye Distance:   Bilateral Distance:    Right Eye Near:   Left Eye Near:    Bilateral Near:     Physical Exam Vitals and nursing note reviewed.  Constitutional:      General: She is not in acute distress.    Appearance: Normal appearance. She is normal weight. She is not ill-appearing, toxic-appearing or diaphoretic.  Cardiovascular:     Rate and Rhythm: Normal rate and regular rhythm.     Pulses: Normal pulses.     Heart sounds: Normal heart sounds. No murmur. No friction rub. No gallop.   Pulmonary:     Effort: Pulmonary effort is normal.  No respiratory distress.     Breath sounds: Normal breath sounds. No stridor. No wheezing, rhonchi or rales.  Chest:     Chest wall: No tenderness.  Skin:    General: Skin is warm.     Capillary Refill: Capillary refill takes less than 2 seconds.     Findings: Erythema and rash present.  Neurological:     Mental Status: She is alert.      UC Treatments / Results  Labs (all labs ordered are listed, but only abnormal results are displayed) Labs Reviewed - No data to display  EKG   Radiology No results found.  Procedures Procedures (including critical care time)  Medications Ordered in UC Medications - No data to display  Initial Impression / Assessment and Plan / UC Course  I have reviewed the triage vital signs and the nursing notes.  Pertinent labs & imaging results that were available during my care of the patient were reviewed by me and considered in my medical decision making (see chart for details).    Patient is stable for discharge.  She is within 72 hours of tick bite.  Single dose doxycycline 200 mg is recommended that patient would like to receive 10 days course of doxycycline instead.  Final diagnoses:  Tick bite of right female breast, initial encounter  Rash     Discharge Instructions     Prescribed 10 days course of doxycycline. To prevent tick bites, wear long sleeves, long pants, and light colors. Use DEET insect repellent. Follow the instructions on the bottle. If the tick is biting, do not try to remove it with heat, alcohol, petroleum jelly, or fingernail polish. Use tweezers, curved forceps, or a tick-removal tool to grasp the tick. Gently pull up until the tick lets go. Do not twist or jerk the tick. Do not squeeze or crush the tick. Return here or go to ER if you have any new or worsening symptoms (rash, nausea, vomiting, fever, chills, headache, fatigue)     ED Prescriptions    Medication Sig Dispense Auth. Provider   doxycycline  (VIBRAMYCIN) 100 MG capsule Take 1 capsule (100 mg total) by mouth 2 (two) times daily. 20 capsule Urias Sheek, Darrelyn Hillock, FNP     PDMP not reviewed this encounter.   Alexavier Tsutsui, Darrelyn Hillock,  FNP 08/20/19 [place], Forbes, FNP 08/20/19 [phone]

## 2019-08-22 DIAGNOSIS — J301 Allergic rhinitis due to pollen: Secondary | ICD-10-CM | POA: Diagnosis not present

## 2019-08-22 DIAGNOSIS — J3081 Allergic rhinitis due to animal (cat) (dog) hair and dander: Secondary | ICD-10-CM | POA: Diagnosis not present

## 2019-08-22 DIAGNOSIS — J3089 Other allergic rhinitis: Secondary | ICD-10-CM | POA: Diagnosis not present

## 2019-08-29 DIAGNOSIS — J3081 Allergic rhinitis due to animal (cat) (dog) hair and dander: Secondary | ICD-10-CM | POA: Diagnosis not present

## 2019-08-29 DIAGNOSIS — J3089 Other allergic rhinitis: Secondary | ICD-10-CM | POA: Diagnosis not present

## 2019-08-29 DIAGNOSIS — J301 Allergic rhinitis due to pollen: Secondary | ICD-10-CM | POA: Diagnosis not present

## 2019-09-05 DIAGNOSIS — J3089 Other allergic rhinitis: Secondary | ICD-10-CM | POA: Diagnosis not present

## 2019-09-05 DIAGNOSIS — J3081 Allergic rhinitis due to animal (cat) (dog) hair and dander: Secondary | ICD-10-CM | POA: Diagnosis not present

## 2019-09-05 DIAGNOSIS — J301 Allergic rhinitis due to pollen: Secondary | ICD-10-CM | POA: Diagnosis not present

## 2019-09-11 DIAGNOSIS — J301 Allergic rhinitis due to pollen: Secondary | ICD-10-CM | POA: Diagnosis not present

## 2019-09-11 DIAGNOSIS — J3081 Allergic rhinitis due to animal (cat) (dog) hair and dander: Secondary | ICD-10-CM | POA: Diagnosis not present

## 2019-09-11 DIAGNOSIS — J3089 Other allergic rhinitis: Secondary | ICD-10-CM | POA: Diagnosis not present

## 2019-10-13 DIAGNOSIS — J3089 Other allergic rhinitis: Secondary | ICD-10-CM | POA: Diagnosis not present

## 2019-10-13 DIAGNOSIS — J3081 Allergic rhinitis due to animal (cat) (dog) hair and dander: Secondary | ICD-10-CM | POA: Diagnosis not present

## 2019-10-13 DIAGNOSIS — J301 Allergic rhinitis due to pollen: Secondary | ICD-10-CM | POA: Diagnosis not present

## 2019-10-20 DIAGNOSIS — J3081 Allergic rhinitis due to animal (cat) (dog) hair and dander: Secondary | ICD-10-CM | POA: Diagnosis not present

## 2019-10-20 DIAGNOSIS — J301 Allergic rhinitis due to pollen: Secondary | ICD-10-CM | POA: Diagnosis not present

## 2019-10-20 DIAGNOSIS — J3089 Other allergic rhinitis: Secondary | ICD-10-CM | POA: Diagnosis not present

## 2019-10-31 DIAGNOSIS — J301 Allergic rhinitis due to pollen: Secondary | ICD-10-CM | POA: Diagnosis not present

## 2019-10-31 DIAGNOSIS — J3089 Other allergic rhinitis: Secondary | ICD-10-CM | POA: Diagnosis not present

## 2019-10-31 DIAGNOSIS — J3081 Allergic rhinitis due to animal (cat) (dog) hair and dander: Secondary | ICD-10-CM | POA: Diagnosis not present

## 2019-11-09 DIAGNOSIS — J3089 Other allergic rhinitis: Secondary | ICD-10-CM | POA: Diagnosis not present

## 2019-11-09 DIAGNOSIS — J3081 Allergic rhinitis due to animal (cat) (dog) hair and dander: Secondary | ICD-10-CM | POA: Diagnosis not present

## 2019-11-09 DIAGNOSIS — J301 Allergic rhinitis due to pollen: Secondary | ICD-10-CM | POA: Diagnosis not present

## 2019-11-14 DIAGNOSIS — J301 Allergic rhinitis due to pollen: Secondary | ICD-10-CM | POA: Diagnosis not present

## 2019-11-14 DIAGNOSIS — J3089 Other allergic rhinitis: Secondary | ICD-10-CM | POA: Diagnosis not present

## 2019-11-14 DIAGNOSIS — J3081 Allergic rhinitis due to animal (cat) (dog) hair and dander: Secondary | ICD-10-CM | POA: Diagnosis not present

## 2019-11-21 DIAGNOSIS — J3081 Allergic rhinitis due to animal (cat) (dog) hair and dander: Secondary | ICD-10-CM | POA: Diagnosis not present

## 2019-11-21 DIAGNOSIS — J3089 Other allergic rhinitis: Secondary | ICD-10-CM | POA: Diagnosis not present

## 2019-11-21 DIAGNOSIS — J301 Allergic rhinitis due to pollen: Secondary | ICD-10-CM | POA: Diagnosis not present

## 2019-11-24 DIAGNOSIS — M8588 Other specified disorders of bone density and structure, other site: Secondary | ICD-10-CM | POA: Diagnosis not present

## 2019-11-24 DIAGNOSIS — Z1231 Encounter for screening mammogram for malignant neoplasm of breast: Secondary | ICD-10-CM | POA: Diagnosis not present

## 2019-11-24 DIAGNOSIS — J301 Allergic rhinitis due to pollen: Secondary | ICD-10-CM | POA: Diagnosis not present

## 2019-11-24 DIAGNOSIS — J3081 Allergic rhinitis due to animal (cat) (dog) hair and dander: Secondary | ICD-10-CM | POA: Diagnosis not present

## 2019-11-24 DIAGNOSIS — N958 Other specified menopausal and perimenopausal disorders: Secondary | ICD-10-CM | POA: Diagnosis not present

## 2019-11-27 DIAGNOSIS — J3089 Other allergic rhinitis: Secondary | ICD-10-CM | POA: Diagnosis not present

## 2019-11-27 DIAGNOSIS — J3081 Allergic rhinitis due to animal (cat) (dog) hair and dander: Secondary | ICD-10-CM | POA: Diagnosis not present

## 2019-11-27 DIAGNOSIS — J301 Allergic rhinitis due to pollen: Secondary | ICD-10-CM | POA: Diagnosis not present

## 2019-12-05 DIAGNOSIS — T63421D Toxic effect of venom of ants, accidental (unintentional), subsequent encounter: Secondary | ICD-10-CM | POA: Diagnosis not present

## 2019-12-05 DIAGNOSIS — J3089 Other allergic rhinitis: Secondary | ICD-10-CM | POA: Diagnosis not present

## 2019-12-05 DIAGNOSIS — J3081 Allergic rhinitis due to animal (cat) (dog) hair and dander: Secondary | ICD-10-CM | POA: Diagnosis not present

## 2019-12-05 DIAGNOSIS — J301 Allergic rhinitis due to pollen: Secondary | ICD-10-CM | POA: Diagnosis not present

## 2019-12-12 DIAGNOSIS — J301 Allergic rhinitis due to pollen: Secondary | ICD-10-CM | POA: Diagnosis not present

## 2019-12-12 DIAGNOSIS — J3081 Allergic rhinitis due to animal (cat) (dog) hair and dander: Secondary | ICD-10-CM | POA: Diagnosis not present

## 2019-12-12 DIAGNOSIS — J3089 Other allergic rhinitis: Secondary | ICD-10-CM | POA: Diagnosis not present

## 2019-12-19 DIAGNOSIS — R635 Abnormal weight gain: Secondary | ICD-10-CM | POA: Diagnosis not present

## 2019-12-19 DIAGNOSIS — T63421D Toxic effect of venom of ants, accidental (unintentional), subsequent encounter: Secondary | ICD-10-CM | POA: Diagnosis not present

## 2019-12-19 DIAGNOSIS — J3089 Other allergic rhinitis: Secondary | ICD-10-CM | POA: Diagnosis not present

## 2019-12-19 DIAGNOSIS — J3081 Allergic rhinitis due to animal (cat) (dog) hair and dander: Secondary | ICD-10-CM | POA: Diagnosis not present

## 2019-12-19 DIAGNOSIS — Z6827 Body mass index (BMI) 27.0-27.9, adult: Secondary | ICD-10-CM | POA: Diagnosis not present

## 2019-12-19 DIAGNOSIS — R6882 Decreased libido: Secondary | ICD-10-CM | POA: Diagnosis not present

## 2019-12-19 DIAGNOSIS — J301 Allergic rhinitis due to pollen: Secondary | ICD-10-CM | POA: Diagnosis not present

## 2019-12-19 DIAGNOSIS — M8580 Other specified disorders of bone density and structure, unspecified site: Secondary | ICD-10-CM | POA: Diagnosis not present

## 2019-12-19 DIAGNOSIS — Z01419 Encounter for gynecological examination (general) (routine) without abnormal findings: Secondary | ICD-10-CM | POA: Diagnosis not present

## 2019-12-25 DIAGNOSIS — J301 Allergic rhinitis due to pollen: Secondary | ICD-10-CM | POA: Diagnosis not present

## 2019-12-25 DIAGNOSIS — J3089 Other allergic rhinitis: Secondary | ICD-10-CM | POA: Diagnosis not present

## 2019-12-25 DIAGNOSIS — J3081 Allergic rhinitis due to animal (cat) (dog) hair and dander: Secondary | ICD-10-CM | POA: Diagnosis not present

## 2019-12-25 DIAGNOSIS — T63421D Toxic effect of venom of ants, accidental (unintentional), subsequent encounter: Secondary | ICD-10-CM | POA: Diagnosis not present

## 2019-12-29 DIAGNOSIS — J3089 Other allergic rhinitis: Secondary | ICD-10-CM | POA: Diagnosis not present

## 2019-12-29 DIAGNOSIS — J3081 Allergic rhinitis due to animal (cat) (dog) hair and dander: Secondary | ICD-10-CM | POA: Diagnosis not present

## 2019-12-29 DIAGNOSIS — J301 Allergic rhinitis due to pollen: Secondary | ICD-10-CM | POA: Diagnosis not present

## 2020-01-04 DIAGNOSIS — J3081 Allergic rhinitis due to animal (cat) (dog) hair and dander: Secondary | ICD-10-CM | POA: Diagnosis not present

## 2020-01-04 DIAGNOSIS — J301 Allergic rhinitis due to pollen: Secondary | ICD-10-CM | POA: Diagnosis not present

## 2020-01-04 DIAGNOSIS — J3089 Other allergic rhinitis: Secondary | ICD-10-CM | POA: Diagnosis not present

## 2020-01-09 DIAGNOSIS — J301 Allergic rhinitis due to pollen: Secondary | ICD-10-CM | POA: Diagnosis not present

## 2020-01-09 DIAGNOSIS — J3089 Other allergic rhinitis: Secondary | ICD-10-CM | POA: Diagnosis not present

## 2020-01-09 DIAGNOSIS — J3081 Allergic rhinitis due to animal (cat) (dog) hair and dander: Secondary | ICD-10-CM | POA: Diagnosis not present

## 2020-01-15 DIAGNOSIS — Z23 Encounter for immunization: Secondary | ICD-10-CM | POA: Diagnosis not present

## 2020-01-16 DIAGNOSIS — J3089 Other allergic rhinitis: Secondary | ICD-10-CM | POA: Diagnosis not present

## 2020-01-16 DIAGNOSIS — J3081 Allergic rhinitis due to animal (cat) (dog) hair and dander: Secondary | ICD-10-CM | POA: Diagnosis not present

## 2020-01-16 DIAGNOSIS — J301 Allergic rhinitis due to pollen: Secondary | ICD-10-CM | POA: Diagnosis not present

## 2020-01-23 DIAGNOSIS — J301 Allergic rhinitis due to pollen: Secondary | ICD-10-CM | POA: Diagnosis not present

## 2020-01-23 DIAGNOSIS — J3081 Allergic rhinitis due to animal (cat) (dog) hair and dander: Secondary | ICD-10-CM | POA: Diagnosis not present

## 2020-01-23 DIAGNOSIS — J3089 Other allergic rhinitis: Secondary | ICD-10-CM | POA: Diagnosis not present

## 2020-01-30 DIAGNOSIS — Z803 Family history of malignant neoplasm of breast: Secondary | ICD-10-CM | POA: Diagnosis not present

## 2020-01-30 DIAGNOSIS — J3081 Allergic rhinitis due to animal (cat) (dog) hair and dander: Secondary | ICD-10-CM | POA: Diagnosis not present

## 2020-01-30 DIAGNOSIS — J3089 Other allergic rhinitis: Secondary | ICD-10-CM | POA: Diagnosis not present

## 2020-01-30 DIAGNOSIS — Z8041 Family history of malignant neoplasm of ovary: Secondary | ICD-10-CM | POA: Diagnosis not present

## 2020-01-30 DIAGNOSIS — J301 Allergic rhinitis due to pollen: Secondary | ICD-10-CM | POA: Diagnosis not present

## 2020-02-08 DIAGNOSIS — J3089 Other allergic rhinitis: Secondary | ICD-10-CM | POA: Diagnosis not present

## 2020-02-08 DIAGNOSIS — J301 Allergic rhinitis due to pollen: Secondary | ICD-10-CM | POA: Diagnosis not present

## 2020-02-08 DIAGNOSIS — J3081 Allergic rhinitis due to animal (cat) (dog) hair and dander: Secondary | ICD-10-CM | POA: Diagnosis not present

## 2020-02-13 DIAGNOSIS — J3081 Allergic rhinitis due to animal (cat) (dog) hair and dander: Secondary | ICD-10-CM | POA: Diagnosis not present

## 2020-02-13 DIAGNOSIS — J3089 Other allergic rhinitis: Secondary | ICD-10-CM | POA: Diagnosis not present

## 2020-02-13 DIAGNOSIS — J301 Allergic rhinitis due to pollen: Secondary | ICD-10-CM | POA: Diagnosis not present

## 2020-02-21 IMAGING — US US THYROID
1 series · 12 of 25 positions shown · non-contrast
Comparison: 10/07/2017 and previous back to 04/12/2013

CLINICAL DATA: Nodules. Previous FNA biopsy of mid right nodule
10/07/2017. Previous FNA biopsy of right lower pole nodule
04/27/2013.

EXAM:
THYROID ULTRASOUND
TECHNIQUE: Ultrasound examination of the thyroid gland and adjacent soft
tissues was performed.

[Series 1: us thyroid · 0.03mm/px · 12 of 50 slices shown]
[im 3/50]
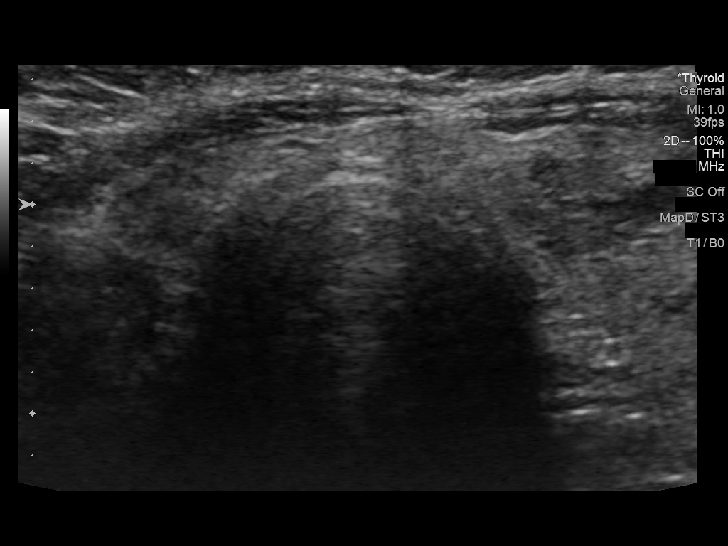
[im 7/50]
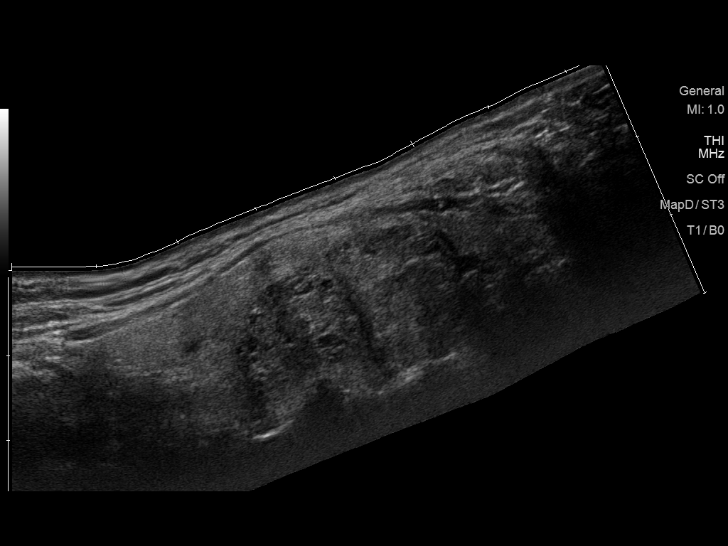
[im 11/50]
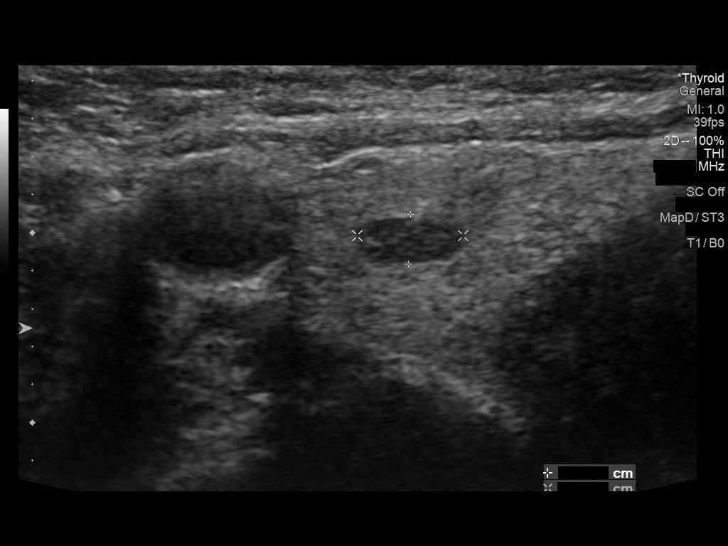
[im 15/50]
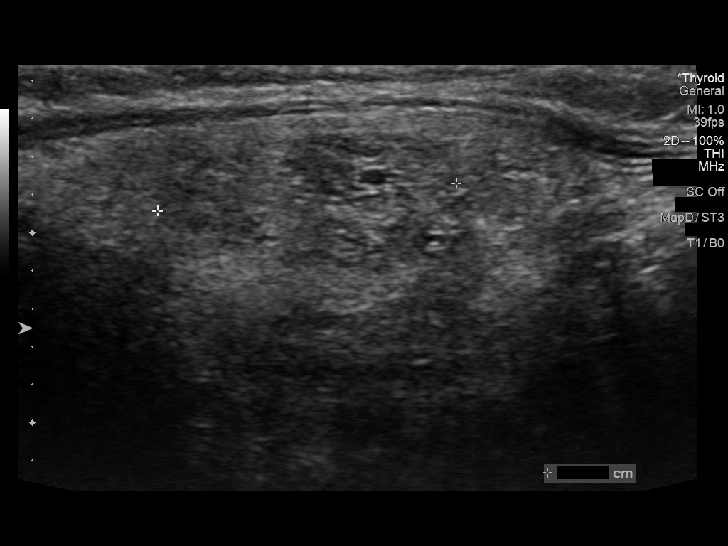
[im 19/50]
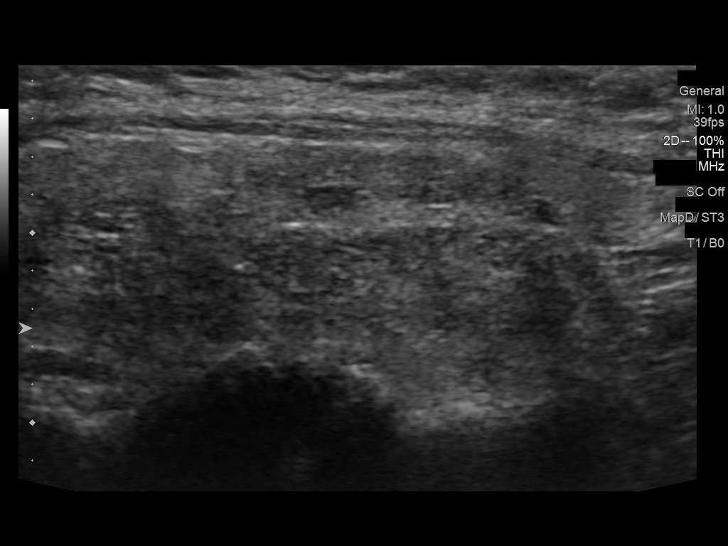
[im 23/50]
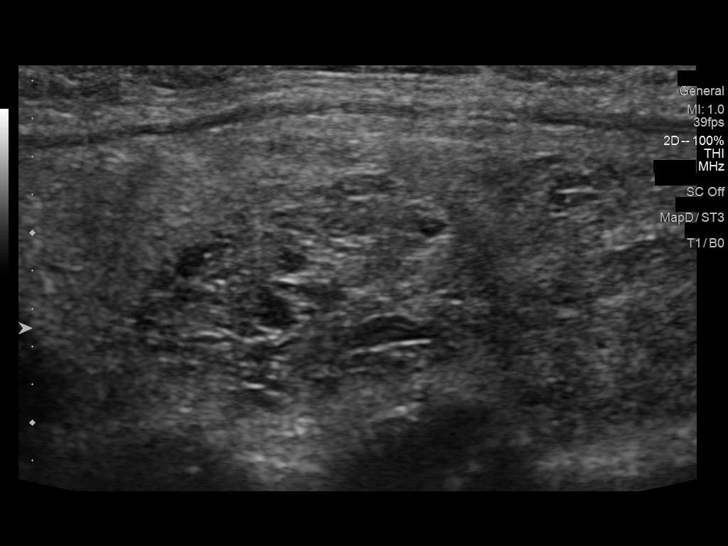
[im 27/50]
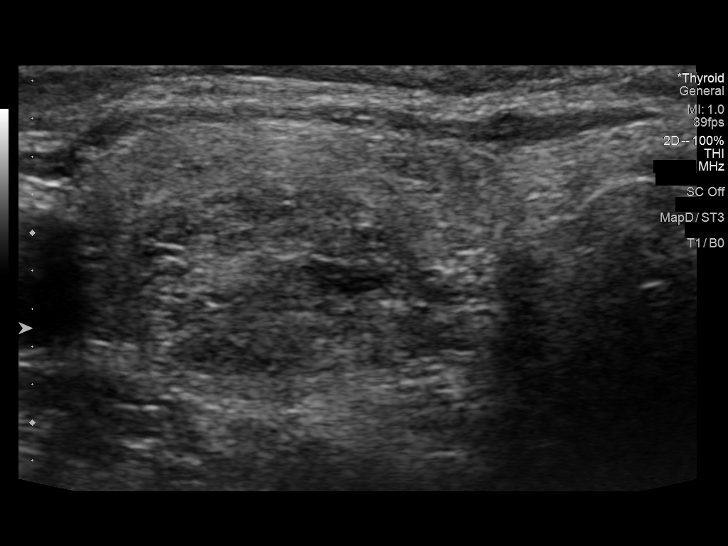
[im 31/50]
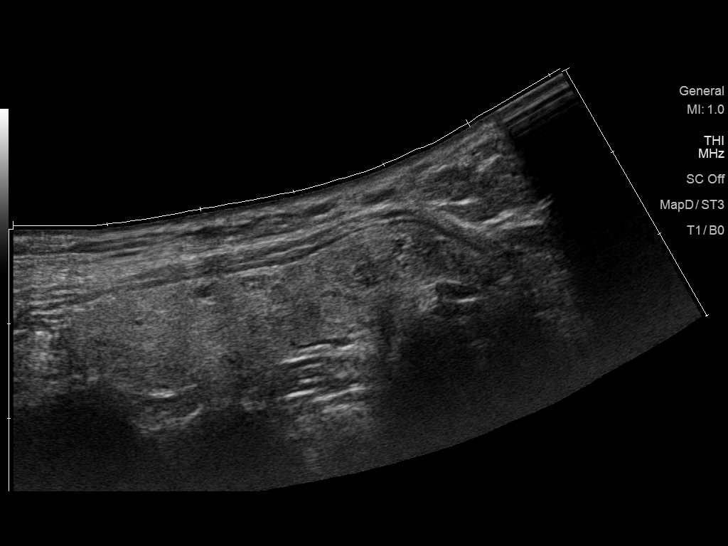
[im 35/50]
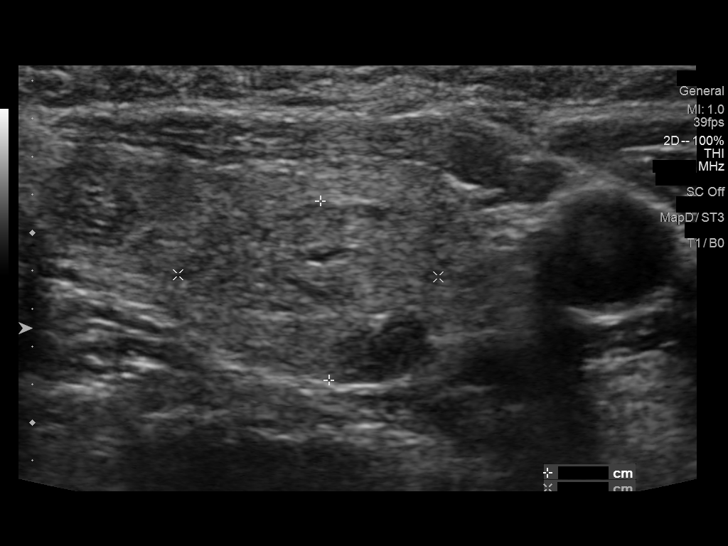
[im 39/50]
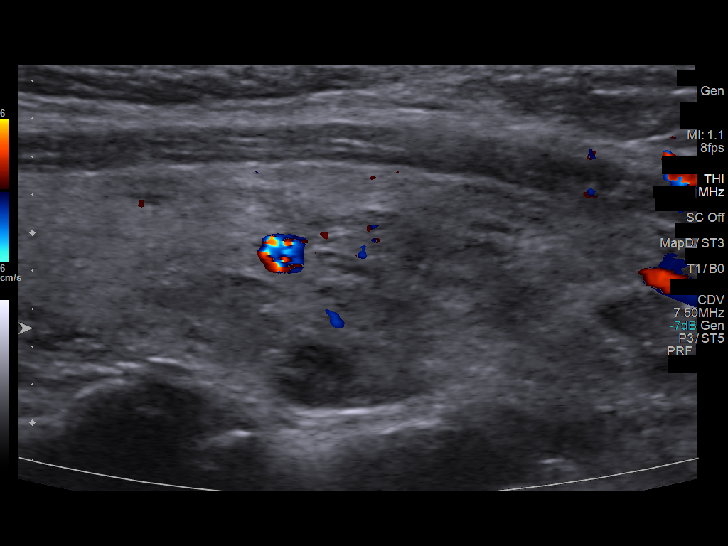
[im 43/50]
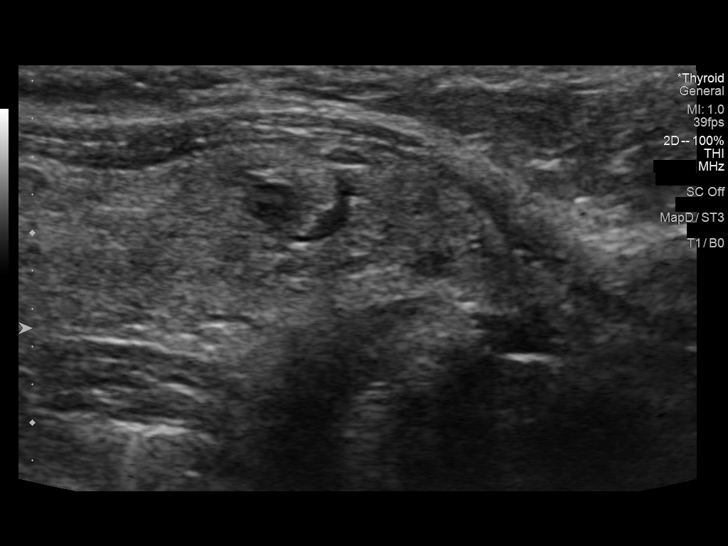
[im 47/50]
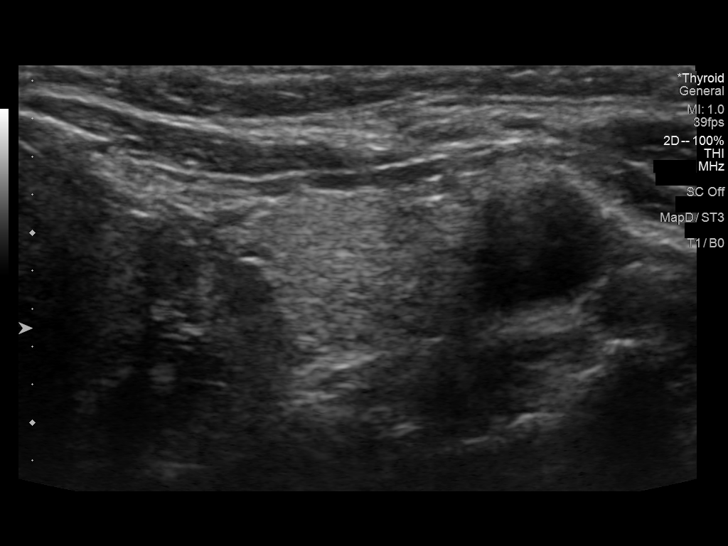

[12 of 25 positions shown; findings below may reference images not displayed]

FINDINGS: Parenchymal Echotexture: Moderately heterogenous

Isthmus: 0.3 cm thickness, stable

Right lobe: 6.1 x 2.1 x 2.4 cm, previously 5.7 x 1.8 x

Left lobe: 4.8 x 1.1 x 2.1 cm, previously 4.6 x 1.4 x

_________________________________________________________

Estimated total number of nodules >/= 1 cm: 5

Number of spongiform nodules >/=  2 cm not described below (TR1): 0

Number of mixed cystic and solid nodules >/= 1.5 cm not described
below (TR2): 0

_________________________________________________________

0.6 cm hypoechoic nodule without calcifications, superior right;
This nodule does NOT meet TI-RADS criteria for biopsy or dedicated
follow-up.

1.9 x 1.7 x 1.1 cm mid right nodule, previously 1.7 x 1.2 x 0.8;
this was previously biopsied

Nodule # 3:

Prior biopsy: No

Location: Right; Inferior anterior

Maximum size: 1.6 cm; Other 2 dimensions: 0.8 x 1.4 cm, previously,
1.7 x 0.8 x 1.2 cm

Composition: solid/almost completely solid (2)

Echogenicity: isoechoic (1)

Shape: not taller-than-wide (0)

Margins: ill-defined (0)

Echogenic foci: none (0)

ACR TI-RADS total points: 3.

ACR TI-RADS risk category:  TR3 (3 points).

Significant change in size (>/= 20% in two dimensions and minimal
increase of 2 mm): No

Change in features: No

Change in ACR TI-RADS risk category: No

ACR TI-RADS recommendations:

*Given size (>/= 1.5 - 2.4 cm) and appearance, a follow-up
ultrasound in 1 year should be considered based on TI-RADS criteria.

_________________________________________________________

1.4 x 1.2 x 0.9 cm right inferior posterior, previously 1.4 x 1.1 x
0.8; this was previously biopsied

Nodule # 5:

Prior biopsy: No

Location: Left; Mid

Maximum size: 1.8 cm; Other 2 dimensions: 1.4 x 0.9 cm, previously,
1.4 x 1 x 0.7 cm

Composition: solid/almost completely solid (2)

Echogenicity: isoechoic (1)

Shape: not taller-than-wide (0)

Margins: ill-defined (0)

Echogenic foci: none (0)

ACR TI-RADS total points: 3.

ACR TI-RADS risk category:  TR3 (3 points).

Significant change in size (>/= 20% in two dimensions and minimal
increase of 2 mm): Yes

Change in features: No

Change in ACR TI-RADS risk category: No

ACR TI-RADS recommendations:

*Given size (>/= 1.5 - 2.4 cm) and appearance, a follow-up
ultrasound in 1 year should be considered based on TI-RADS criteria.

_________________________________________________________

Nodule # 6:

Prior biopsy: No

Location: Left; Inferior

Maximum size: 1.7 cm; Other 2 dimensions: 1.1 x 0.6 cm, previously,
1 x 0.9 x 0.7 cm

Composition: solid/almost completely solid (2)

Echogenicity: isoechoic (1)

Shape: not taller-than-wide (0)

Margins: ill-defined (0)

Echogenic foci: none (0)

ACR TI-RADS total points: 3.

ACR TI-RADS risk category:  TR3 (3 points).

Significant change in size (>/= 20% in two dimensions and minimal
increase of 2 mm): Yes

Change in features: No

Change in ACR TI-RADS risk category: No

ACR TI-RADS recommendations:

*Given size (>/= 1.5 - 2.4 cm) and appearance, a follow-up
ultrasound in 1 year should be considered based on TI-RADS criteria.
IMPRESSION: 1. Thyromegaly with bilateral nodules. None currently meets criteria
for biopsy.
2. Recommend annual/biennial ultrasound follow-up of nodules as
above, until stability x5 years confirmed.

The above is in keeping with the ACR TI-RADS recommendations - [HOSPITAL] 9267;[DATE].

## 2020-02-29 DIAGNOSIS — J301 Allergic rhinitis due to pollen: Secondary | ICD-10-CM | POA: Diagnosis not present

## 2020-02-29 DIAGNOSIS — J3081 Allergic rhinitis due to animal (cat) (dog) hair and dander: Secondary | ICD-10-CM | POA: Diagnosis not present

## 2020-02-29 DIAGNOSIS — J3089 Other allergic rhinitis: Secondary | ICD-10-CM | POA: Diagnosis not present

## 2020-03-07 DIAGNOSIS — J301 Allergic rhinitis due to pollen: Secondary | ICD-10-CM | POA: Diagnosis not present

## 2020-03-07 DIAGNOSIS — J3089 Other allergic rhinitis: Secondary | ICD-10-CM | POA: Diagnosis not present

## 2020-03-07 DIAGNOSIS — T63421D Toxic effect of venom of ants, accidental (unintentional), subsequent encounter: Secondary | ICD-10-CM | POA: Diagnosis not present

## 2020-03-07 DIAGNOSIS — J3081 Allergic rhinitis due to animal (cat) (dog) hair and dander: Secondary | ICD-10-CM | POA: Diagnosis not present

## 2020-03-14 DIAGNOSIS — T63421D Toxic effect of venom of ants, accidental (unintentional), subsequent encounter: Secondary | ICD-10-CM | POA: Diagnosis not present

## 2020-03-14 DIAGNOSIS — J301 Allergic rhinitis due to pollen: Secondary | ICD-10-CM | POA: Diagnosis not present

## 2020-03-14 DIAGNOSIS — J3089 Other allergic rhinitis: Secondary | ICD-10-CM | POA: Diagnosis not present

## 2020-03-14 DIAGNOSIS — J3081 Allergic rhinitis due to animal (cat) (dog) hair and dander: Secondary | ICD-10-CM | POA: Diagnosis not present

## 2020-03-25 DIAGNOSIS — J301 Allergic rhinitis due to pollen: Secondary | ICD-10-CM | POA: Diagnosis not present

## 2020-03-25 DIAGNOSIS — J3081 Allergic rhinitis due to animal (cat) (dog) hair and dander: Secondary | ICD-10-CM | POA: Diagnosis not present

## 2020-03-25 DIAGNOSIS — J3089 Other allergic rhinitis: Secondary | ICD-10-CM | POA: Diagnosis not present

## 2020-03-26 DIAGNOSIS — Z8041 Family history of malignant neoplasm of ovary: Secondary | ICD-10-CM | POA: Diagnosis not present

## 2020-04-04 DIAGNOSIS — J3081 Allergic rhinitis due to animal (cat) (dog) hair and dander: Secondary | ICD-10-CM | POA: Diagnosis not present

## 2020-04-04 DIAGNOSIS — J3089 Other allergic rhinitis: Secondary | ICD-10-CM | POA: Diagnosis not present

## 2020-04-04 DIAGNOSIS — J301 Allergic rhinitis due to pollen: Secondary | ICD-10-CM | POA: Diagnosis not present

## 2020-04-06 DIAGNOSIS — Z23 Encounter for immunization: Secondary | ICD-10-CM | POA: Diagnosis not present

## 2020-04-11 DIAGNOSIS — J301 Allergic rhinitis due to pollen: Secondary | ICD-10-CM | POA: Diagnosis not present

## 2020-04-11 DIAGNOSIS — J3081 Allergic rhinitis due to animal (cat) (dog) hair and dander: Secondary | ICD-10-CM | POA: Diagnosis not present

## 2020-04-11 DIAGNOSIS — J3089 Other allergic rhinitis: Secondary | ICD-10-CM | POA: Diagnosis not present

## 2020-04-18 DIAGNOSIS — J3081 Allergic rhinitis due to animal (cat) (dog) hair and dander: Secondary | ICD-10-CM | POA: Diagnosis not present

## 2020-04-18 DIAGNOSIS — J301 Allergic rhinitis due to pollen: Secondary | ICD-10-CM | POA: Diagnosis not present

## 2020-04-18 DIAGNOSIS — J3089 Other allergic rhinitis: Secondary | ICD-10-CM | POA: Diagnosis not present

## 2020-04-25 DIAGNOSIS — J3081 Allergic rhinitis due to animal (cat) (dog) hair and dander: Secondary | ICD-10-CM | POA: Diagnosis not present

## 2020-04-25 DIAGNOSIS — J3089 Other allergic rhinitis: Secondary | ICD-10-CM | POA: Diagnosis not present

## 2020-04-25 DIAGNOSIS — J301 Allergic rhinitis due to pollen: Secondary | ICD-10-CM | POA: Diagnosis not present

## 2020-05-02 DIAGNOSIS — J3081 Allergic rhinitis due to animal (cat) (dog) hair and dander: Secondary | ICD-10-CM | POA: Diagnosis not present

## 2020-05-02 DIAGNOSIS — J3089 Other allergic rhinitis: Secondary | ICD-10-CM | POA: Diagnosis not present

## 2020-05-02 DIAGNOSIS — J301 Allergic rhinitis due to pollen: Secondary | ICD-10-CM | POA: Diagnosis not present

## 2020-05-08 DIAGNOSIS — J3081 Allergic rhinitis due to animal (cat) (dog) hair and dander: Secondary | ICD-10-CM | POA: Diagnosis not present

## 2020-05-08 DIAGNOSIS — J301 Allergic rhinitis due to pollen: Secondary | ICD-10-CM | POA: Diagnosis not present

## 2020-05-08 DIAGNOSIS — J3089 Other allergic rhinitis: Secondary | ICD-10-CM | POA: Diagnosis not present

## 2020-05-08 DIAGNOSIS — T63421A Toxic effect of venom of ants, accidental (unintentional), initial encounter: Secondary | ICD-10-CM | POA: Diagnosis not present

## 2020-05-10 DIAGNOSIS — J3081 Allergic rhinitis due to animal (cat) (dog) hair and dander: Secondary | ICD-10-CM | POA: Diagnosis not present

## 2020-05-10 DIAGNOSIS — J3089 Other allergic rhinitis: Secondary | ICD-10-CM | POA: Diagnosis not present

## 2020-05-10 DIAGNOSIS — J301 Allergic rhinitis due to pollen: Secondary | ICD-10-CM | POA: Diagnosis not present

## 2020-05-16 DIAGNOSIS — J3081 Allergic rhinitis due to animal (cat) (dog) hair and dander: Secondary | ICD-10-CM | POA: Diagnosis not present

## 2020-05-16 DIAGNOSIS — J301 Allergic rhinitis due to pollen: Secondary | ICD-10-CM | POA: Diagnosis not present

## 2020-05-16 DIAGNOSIS — J3089 Other allergic rhinitis: Secondary | ICD-10-CM | POA: Diagnosis not present

## 2020-05-23 DIAGNOSIS — J301 Allergic rhinitis due to pollen: Secondary | ICD-10-CM | POA: Diagnosis not present

## 2020-05-23 DIAGNOSIS — J3081 Allergic rhinitis due to animal (cat) (dog) hair and dander: Secondary | ICD-10-CM | POA: Diagnosis not present

## 2020-05-23 DIAGNOSIS — J3089 Other allergic rhinitis: Secondary | ICD-10-CM | POA: Diagnosis not present

## 2020-05-28 DIAGNOSIS — J301 Allergic rhinitis due to pollen: Secondary | ICD-10-CM | POA: Diagnosis not present

## 2020-05-28 DIAGNOSIS — J3081 Allergic rhinitis due to animal (cat) (dog) hair and dander: Secondary | ICD-10-CM | POA: Diagnosis not present

## 2020-05-28 DIAGNOSIS — J3089 Other allergic rhinitis: Secondary | ICD-10-CM | POA: Diagnosis not present

## 2020-05-30 DIAGNOSIS — J3089 Other allergic rhinitis: Secondary | ICD-10-CM | POA: Diagnosis not present

## 2020-05-30 DIAGNOSIS — J3081 Allergic rhinitis due to animal (cat) (dog) hair and dander: Secondary | ICD-10-CM | POA: Diagnosis not present

## 2020-05-30 DIAGNOSIS — J301 Allergic rhinitis due to pollen: Secondary | ICD-10-CM | POA: Diagnosis not present

## 2020-06-04 DIAGNOSIS — J3089 Other allergic rhinitis: Secondary | ICD-10-CM | POA: Diagnosis not present

## 2020-06-04 DIAGNOSIS — J301 Allergic rhinitis due to pollen: Secondary | ICD-10-CM | POA: Diagnosis not present

## 2020-06-04 DIAGNOSIS — J3081 Allergic rhinitis due to animal (cat) (dog) hair and dander: Secondary | ICD-10-CM | POA: Diagnosis not present

## 2020-06-11 DIAGNOSIS — J3081 Allergic rhinitis due to animal (cat) (dog) hair and dander: Secondary | ICD-10-CM | POA: Diagnosis not present

## 2020-06-11 DIAGNOSIS — J301 Allergic rhinitis due to pollen: Secondary | ICD-10-CM | POA: Diagnosis not present

## 2020-06-11 DIAGNOSIS — J3089 Other allergic rhinitis: Secondary | ICD-10-CM | POA: Diagnosis not present

## 2020-06-21 DIAGNOSIS — J301 Allergic rhinitis due to pollen: Secondary | ICD-10-CM | POA: Diagnosis not present

## 2020-06-21 DIAGNOSIS — J3081 Allergic rhinitis due to animal (cat) (dog) hair and dander: Secondary | ICD-10-CM | POA: Diagnosis not present

## 2020-06-21 DIAGNOSIS — J3089 Other allergic rhinitis: Secondary | ICD-10-CM | POA: Diagnosis not present

## 2020-06-27 DIAGNOSIS — J301 Allergic rhinitis due to pollen: Secondary | ICD-10-CM | POA: Diagnosis not present

## 2020-06-27 DIAGNOSIS — J3089 Other allergic rhinitis: Secondary | ICD-10-CM | POA: Diagnosis not present

## 2020-06-27 DIAGNOSIS — J3081 Allergic rhinitis due to animal (cat) (dog) hair and dander: Secondary | ICD-10-CM | POA: Diagnosis not present

## 2020-07-05 DIAGNOSIS — J301 Allergic rhinitis due to pollen: Secondary | ICD-10-CM | POA: Diagnosis not present

## 2020-07-05 DIAGNOSIS — J3081 Allergic rhinitis due to animal (cat) (dog) hair and dander: Secondary | ICD-10-CM | POA: Diagnosis not present

## 2020-07-05 DIAGNOSIS — J3089 Other allergic rhinitis: Secondary | ICD-10-CM | POA: Diagnosis not present

## 2020-07-11 DIAGNOSIS — J301 Allergic rhinitis due to pollen: Secondary | ICD-10-CM | POA: Diagnosis not present

## 2020-07-11 DIAGNOSIS — J3089 Other allergic rhinitis: Secondary | ICD-10-CM | POA: Diagnosis not present

## 2020-07-11 DIAGNOSIS — T63421D Toxic effect of venom of ants, accidental (unintentional), subsequent encounter: Secondary | ICD-10-CM | POA: Diagnosis not present

## 2020-07-11 DIAGNOSIS — J3081 Allergic rhinitis due to animal (cat) (dog) hair and dander: Secondary | ICD-10-CM | POA: Diagnosis not present

## 2020-07-18 DIAGNOSIS — J301 Allergic rhinitis due to pollen: Secondary | ICD-10-CM | POA: Diagnosis not present

## 2020-07-18 DIAGNOSIS — J3081 Allergic rhinitis due to animal (cat) (dog) hair and dander: Secondary | ICD-10-CM | POA: Diagnosis not present

## 2020-07-18 DIAGNOSIS — J3089 Other allergic rhinitis: Secondary | ICD-10-CM | POA: Diagnosis not present

## 2020-07-25 DIAGNOSIS — J3089 Other allergic rhinitis: Secondary | ICD-10-CM | POA: Diagnosis not present

## 2020-07-25 DIAGNOSIS — J3081 Allergic rhinitis due to animal (cat) (dog) hair and dander: Secondary | ICD-10-CM | POA: Diagnosis not present

## 2020-07-25 DIAGNOSIS — J301 Allergic rhinitis due to pollen: Secondary | ICD-10-CM | POA: Diagnosis not present

## 2020-08-01 DIAGNOSIS — J3081 Allergic rhinitis due to animal (cat) (dog) hair and dander: Secondary | ICD-10-CM | POA: Diagnosis not present

## 2020-08-01 DIAGNOSIS — J301 Allergic rhinitis due to pollen: Secondary | ICD-10-CM | POA: Diagnosis not present

## 2020-08-01 DIAGNOSIS — J3089 Other allergic rhinitis: Secondary | ICD-10-CM | POA: Diagnosis not present

## 2020-08-08 DIAGNOSIS — J301 Allergic rhinitis due to pollen: Secondary | ICD-10-CM | POA: Diagnosis not present

## 2020-08-08 DIAGNOSIS — J3081 Allergic rhinitis due to animal (cat) (dog) hair and dander: Secondary | ICD-10-CM | POA: Diagnosis not present

## 2020-08-08 DIAGNOSIS — J3089 Other allergic rhinitis: Secondary | ICD-10-CM | POA: Diagnosis not present

## 2020-08-16 DIAGNOSIS — J3089 Other allergic rhinitis: Secondary | ICD-10-CM | POA: Diagnosis not present

## 2020-08-16 DIAGNOSIS — J301 Allergic rhinitis due to pollen: Secondary | ICD-10-CM | POA: Diagnosis not present

## 2020-08-16 DIAGNOSIS — J3081 Allergic rhinitis due to animal (cat) (dog) hair and dander: Secondary | ICD-10-CM | POA: Diagnosis not present

## 2020-08-22 DIAGNOSIS — J301 Allergic rhinitis due to pollen: Secondary | ICD-10-CM | POA: Diagnosis not present

## 2020-08-22 DIAGNOSIS — J3081 Allergic rhinitis due to animal (cat) (dog) hair and dander: Secondary | ICD-10-CM | POA: Diagnosis not present

## 2020-08-22 DIAGNOSIS — J3089 Other allergic rhinitis: Secondary | ICD-10-CM | POA: Diagnosis not present

## 2020-08-27 DIAGNOSIS — L821 Other seborrheic keratosis: Secondary | ICD-10-CM | POA: Diagnosis not present

## 2020-08-27 DIAGNOSIS — L814 Other melanin hyperpigmentation: Secondary | ICD-10-CM | POA: Diagnosis not present

## 2020-08-27 DIAGNOSIS — D229 Melanocytic nevi, unspecified: Secondary | ICD-10-CM | POA: Diagnosis not present

## 2020-08-27 DIAGNOSIS — L819 Disorder of pigmentation, unspecified: Secondary | ICD-10-CM | POA: Diagnosis not present

## 2020-08-30 DIAGNOSIS — J3081 Allergic rhinitis due to animal (cat) (dog) hair and dander: Secondary | ICD-10-CM | POA: Diagnosis not present

## 2020-08-30 DIAGNOSIS — J3089 Other allergic rhinitis: Secondary | ICD-10-CM | POA: Diagnosis not present

## 2020-08-30 DIAGNOSIS — J301 Allergic rhinitis due to pollen: Secondary | ICD-10-CM | POA: Diagnosis not present

## 2020-09-03 DIAGNOSIS — Z0000 Encounter for general adult medical examination without abnormal findings: Secondary | ICD-10-CM | POA: Diagnosis not present

## 2020-09-05 DIAGNOSIS — J3081 Allergic rhinitis due to animal (cat) (dog) hair and dander: Secondary | ICD-10-CM | POA: Diagnosis not present

## 2020-09-05 DIAGNOSIS — J301 Allergic rhinitis due to pollen: Secondary | ICD-10-CM | POA: Diagnosis not present

## 2020-09-05 DIAGNOSIS — J3089 Other allergic rhinitis: Secondary | ICD-10-CM | POA: Diagnosis not present

## 2020-09-12 DIAGNOSIS — E782 Mixed hyperlipidemia: Secondary | ICD-10-CM | POA: Diagnosis not present

## 2020-09-12 DIAGNOSIS — R457 State of emotional shock and stress, unspecified: Secondary | ICD-10-CM | POA: Diagnosis not present

## 2020-09-12 DIAGNOSIS — M8580 Other specified disorders of bone density and structure, unspecified site: Secondary | ICD-10-CM | POA: Diagnosis not present

## 2020-09-13 DIAGNOSIS — J3089 Other allergic rhinitis: Secondary | ICD-10-CM | POA: Diagnosis not present

## 2020-09-13 DIAGNOSIS — J301 Allergic rhinitis due to pollen: Secondary | ICD-10-CM | POA: Diagnosis not present

## 2020-09-13 DIAGNOSIS — J3081 Allergic rhinitis due to animal (cat) (dog) hair and dander: Secondary | ICD-10-CM | POA: Diagnosis not present

## 2020-09-19 DIAGNOSIS — J3089 Other allergic rhinitis: Secondary | ICD-10-CM | POA: Diagnosis not present

## 2020-09-19 DIAGNOSIS — J301 Allergic rhinitis due to pollen: Secondary | ICD-10-CM | POA: Diagnosis not present

## 2020-09-19 DIAGNOSIS — J3081 Allergic rhinitis due to animal (cat) (dog) hair and dander: Secondary | ICD-10-CM | POA: Diagnosis not present

## 2020-09-24 DIAGNOSIS — J3089 Other allergic rhinitis: Secondary | ICD-10-CM | POA: Diagnosis not present

## 2020-09-24 DIAGNOSIS — J301 Allergic rhinitis due to pollen: Secondary | ICD-10-CM | POA: Diagnosis not present

## 2020-09-24 DIAGNOSIS — J3081 Allergic rhinitis due to animal (cat) (dog) hair and dander: Secondary | ICD-10-CM | POA: Diagnosis not present

## 2020-09-26 DIAGNOSIS — J301 Allergic rhinitis due to pollen: Secondary | ICD-10-CM | POA: Diagnosis not present

## 2020-09-26 DIAGNOSIS — J3089 Other allergic rhinitis: Secondary | ICD-10-CM | POA: Diagnosis not present

## 2020-09-26 DIAGNOSIS — J3081 Allergic rhinitis due to animal (cat) (dog) hair and dander: Secondary | ICD-10-CM | POA: Diagnosis not present

## 2020-10-02 DIAGNOSIS — J3081 Allergic rhinitis due to animal (cat) (dog) hair and dander: Secondary | ICD-10-CM | POA: Diagnosis not present

## 2020-10-02 DIAGNOSIS — J301 Allergic rhinitis due to pollen: Secondary | ICD-10-CM | POA: Diagnosis not present

## 2020-10-02 DIAGNOSIS — J3089 Other allergic rhinitis: Secondary | ICD-10-CM | POA: Diagnosis not present

## 2020-10-17 DIAGNOSIS — J3081 Allergic rhinitis due to animal (cat) (dog) hair and dander: Secondary | ICD-10-CM | POA: Diagnosis not present

## 2020-10-17 DIAGNOSIS — J3089 Other allergic rhinitis: Secondary | ICD-10-CM | POA: Diagnosis not present

## 2020-10-17 DIAGNOSIS — J301 Allergic rhinitis due to pollen: Secondary | ICD-10-CM | POA: Diagnosis not present

## 2020-10-24 DIAGNOSIS — J3081 Allergic rhinitis due to animal (cat) (dog) hair and dander: Secondary | ICD-10-CM | POA: Diagnosis not present

## 2020-10-24 DIAGNOSIS — T63421D Toxic effect of venom of ants, accidental (unintentional), subsequent encounter: Secondary | ICD-10-CM | POA: Diagnosis not present

## 2020-10-24 DIAGNOSIS — J3089 Other allergic rhinitis: Secondary | ICD-10-CM | POA: Diagnosis not present

## 2020-10-24 DIAGNOSIS — J301 Allergic rhinitis due to pollen: Secondary | ICD-10-CM | POA: Diagnosis not present

## 2020-10-31 DIAGNOSIS — T63421D Toxic effect of venom of ants, accidental (unintentional), subsequent encounter: Secondary | ICD-10-CM | POA: Diagnosis not present

## 2020-10-31 DIAGNOSIS — J3089 Other allergic rhinitis: Secondary | ICD-10-CM | POA: Diagnosis not present

## 2020-10-31 DIAGNOSIS — J3081 Allergic rhinitis due to animal (cat) (dog) hair and dander: Secondary | ICD-10-CM | POA: Diagnosis not present

## 2020-10-31 DIAGNOSIS — J301 Allergic rhinitis due to pollen: Secondary | ICD-10-CM | POA: Diagnosis not present

## 2020-11-07 DIAGNOSIS — J3089 Other allergic rhinitis: Secondary | ICD-10-CM | POA: Diagnosis not present

## 2020-11-07 DIAGNOSIS — J3081 Allergic rhinitis due to animal (cat) (dog) hair and dander: Secondary | ICD-10-CM | POA: Diagnosis not present

## 2020-11-07 DIAGNOSIS — J301 Allergic rhinitis due to pollen: Secondary | ICD-10-CM | POA: Diagnosis not present

## 2020-11-07 DIAGNOSIS — T63421D Toxic effect of venom of ants, accidental (unintentional), subsequent encounter: Secondary | ICD-10-CM | POA: Diagnosis not present

## 2020-11-14 DIAGNOSIS — J3081 Allergic rhinitis due to animal (cat) (dog) hair and dander: Secondary | ICD-10-CM | POA: Diagnosis not present

## 2020-11-14 DIAGNOSIS — J3089 Other allergic rhinitis: Secondary | ICD-10-CM | POA: Diagnosis not present

## 2020-11-14 DIAGNOSIS — J301 Allergic rhinitis due to pollen: Secondary | ICD-10-CM | POA: Diagnosis not present

## 2020-11-21 DIAGNOSIS — J3081 Allergic rhinitis due to animal (cat) (dog) hair and dander: Secondary | ICD-10-CM | POA: Diagnosis not present

## 2020-11-21 DIAGNOSIS — J3089 Other allergic rhinitis: Secondary | ICD-10-CM | POA: Diagnosis not present

## 2020-11-21 DIAGNOSIS — J301 Allergic rhinitis due to pollen: Secondary | ICD-10-CM | POA: Diagnosis not present

## 2020-11-27 DIAGNOSIS — T63421D Toxic effect of venom of ants, accidental (unintentional), subsequent encounter: Secondary | ICD-10-CM | POA: Diagnosis not present

## 2020-11-27 DIAGNOSIS — J301 Allergic rhinitis due to pollen: Secondary | ICD-10-CM | POA: Diagnosis not present

## 2020-11-27 DIAGNOSIS — J3081 Allergic rhinitis due to animal (cat) (dog) hair and dander: Secondary | ICD-10-CM | POA: Diagnosis not present

## 2020-11-27 DIAGNOSIS — J3089 Other allergic rhinitis: Secondary | ICD-10-CM | POA: Diagnosis not present

## 2020-11-28 DIAGNOSIS — J3089 Other allergic rhinitis: Secondary | ICD-10-CM | POA: Diagnosis not present

## 2020-11-28 DIAGNOSIS — J301 Allergic rhinitis due to pollen: Secondary | ICD-10-CM | POA: Diagnosis not present

## 2020-11-28 DIAGNOSIS — J3081 Allergic rhinitis due to animal (cat) (dog) hair and dander: Secondary | ICD-10-CM | POA: Diagnosis not present

## 2020-12-05 DIAGNOSIS — J301 Allergic rhinitis due to pollen: Secondary | ICD-10-CM | POA: Diagnosis not present

## 2020-12-05 DIAGNOSIS — J3081 Allergic rhinitis due to animal (cat) (dog) hair and dander: Secondary | ICD-10-CM | POA: Diagnosis not present

## 2020-12-05 DIAGNOSIS — J3089 Other allergic rhinitis: Secondary | ICD-10-CM | POA: Diagnosis not present

## 2020-12-12 DIAGNOSIS — J3081 Allergic rhinitis due to animal (cat) (dog) hair and dander: Secondary | ICD-10-CM | POA: Diagnosis not present

## 2020-12-12 DIAGNOSIS — J3089 Other allergic rhinitis: Secondary | ICD-10-CM | POA: Diagnosis not present

## 2020-12-12 DIAGNOSIS — J301 Allergic rhinitis due to pollen: Secondary | ICD-10-CM | POA: Diagnosis not present

## 2020-12-19 DIAGNOSIS — J3081 Allergic rhinitis due to animal (cat) (dog) hair and dander: Secondary | ICD-10-CM | POA: Diagnosis not present

## 2020-12-19 DIAGNOSIS — J301 Allergic rhinitis due to pollen: Secondary | ICD-10-CM | POA: Diagnosis not present

## 2020-12-19 DIAGNOSIS — J3089 Other allergic rhinitis: Secondary | ICD-10-CM | POA: Diagnosis not present

## 2020-12-30 DIAGNOSIS — J3081 Allergic rhinitis due to animal (cat) (dog) hair and dander: Secondary | ICD-10-CM | POA: Diagnosis not present

## 2020-12-30 DIAGNOSIS — J3089 Other allergic rhinitis: Secondary | ICD-10-CM | POA: Diagnosis not present

## 2020-12-30 DIAGNOSIS — T63421D Toxic effect of venom of ants, accidental (unintentional), subsequent encounter: Secondary | ICD-10-CM | POA: Diagnosis not present

## 2020-12-30 DIAGNOSIS — J301 Allergic rhinitis due to pollen: Secondary | ICD-10-CM | POA: Diagnosis not present

## 2021-01-14 DIAGNOSIS — J301 Allergic rhinitis due to pollen: Secondary | ICD-10-CM | POA: Diagnosis not present

## 2021-01-14 DIAGNOSIS — J3089 Other allergic rhinitis: Secondary | ICD-10-CM | POA: Diagnosis not present

## 2021-01-14 DIAGNOSIS — J3081 Allergic rhinitis due to animal (cat) (dog) hair and dander: Secondary | ICD-10-CM | POA: Diagnosis not present

## 2021-01-23 DIAGNOSIS — J301 Allergic rhinitis due to pollen: Secondary | ICD-10-CM | POA: Diagnosis not present

## 2021-01-23 DIAGNOSIS — T63421D Toxic effect of venom of ants, accidental (unintentional), subsequent encounter: Secondary | ICD-10-CM | POA: Diagnosis not present

## 2021-01-23 DIAGNOSIS — J3081 Allergic rhinitis due to animal (cat) (dog) hair and dander: Secondary | ICD-10-CM | POA: Diagnosis not present

## 2021-01-23 DIAGNOSIS — J3089 Other allergic rhinitis: Secondary | ICD-10-CM | POA: Diagnosis not present

## 2021-01-30 DIAGNOSIS — J3081 Allergic rhinitis due to animal (cat) (dog) hair and dander: Secondary | ICD-10-CM | POA: Diagnosis not present

## 2021-01-30 DIAGNOSIS — J3089 Other allergic rhinitis: Secondary | ICD-10-CM | POA: Diagnosis not present

## 2021-01-30 DIAGNOSIS — J301 Allergic rhinitis due to pollen: Secondary | ICD-10-CM | POA: Diagnosis not present

## 2021-02-04 DIAGNOSIS — Z01419 Encounter for gynecological examination (general) (routine) without abnormal findings: Secondary | ICD-10-CM | POA: Diagnosis not present

## 2021-02-04 DIAGNOSIS — Z6826 Body mass index (BMI) 26.0-26.9, adult: Secondary | ICD-10-CM | POA: Diagnosis not present

## 2021-02-04 DIAGNOSIS — Z1231 Encounter for screening mammogram for malignant neoplasm of breast: Secondary | ICD-10-CM | POA: Diagnosis not present

## 2021-02-06 DIAGNOSIS — J3081 Allergic rhinitis due to animal (cat) (dog) hair and dander: Secondary | ICD-10-CM | POA: Diagnosis not present

## 2021-02-06 DIAGNOSIS — T63421D Toxic effect of venom of ants, accidental (unintentional), subsequent encounter: Secondary | ICD-10-CM | POA: Diagnosis not present

## 2021-02-06 DIAGNOSIS — J3089 Other allergic rhinitis: Secondary | ICD-10-CM | POA: Diagnosis not present

## 2021-02-06 DIAGNOSIS — J301 Allergic rhinitis due to pollen: Secondary | ICD-10-CM | POA: Diagnosis not present

## 2021-02-13 DIAGNOSIS — J3081 Allergic rhinitis due to animal (cat) (dog) hair and dander: Secondary | ICD-10-CM | POA: Diagnosis not present

## 2021-02-13 DIAGNOSIS — T63421D Toxic effect of venom of ants, accidental (unintentional), subsequent encounter: Secondary | ICD-10-CM | POA: Diagnosis not present

## 2021-02-13 DIAGNOSIS — J3089 Other allergic rhinitis: Secondary | ICD-10-CM | POA: Diagnosis not present

## 2021-02-13 DIAGNOSIS — J301 Allergic rhinitis due to pollen: Secondary | ICD-10-CM | POA: Diagnosis not present

## 2021-02-26 DIAGNOSIS — J3081 Allergic rhinitis due to animal (cat) (dog) hair and dander: Secondary | ICD-10-CM | POA: Diagnosis not present

## 2021-02-26 DIAGNOSIS — J301 Allergic rhinitis due to pollen: Secondary | ICD-10-CM | POA: Diagnosis not present

## 2021-02-26 DIAGNOSIS — J3089 Other allergic rhinitis: Secondary | ICD-10-CM | POA: Diagnosis not present

## 2021-03-13 DIAGNOSIS — J3089 Other allergic rhinitis: Secondary | ICD-10-CM | POA: Diagnosis not present

## 2021-03-13 DIAGNOSIS — J3081 Allergic rhinitis due to animal (cat) (dog) hair and dander: Secondary | ICD-10-CM | POA: Diagnosis not present

## 2021-03-13 DIAGNOSIS — J301 Allergic rhinitis due to pollen: Secondary | ICD-10-CM | POA: Diagnosis not present

## 2021-03-13 DIAGNOSIS — T63421D Toxic effect of venom of ants, accidental (unintentional), subsequent encounter: Secondary | ICD-10-CM | POA: Diagnosis not present

## 2021-03-20 DIAGNOSIS — J301 Allergic rhinitis due to pollen: Secondary | ICD-10-CM | POA: Diagnosis not present

## 2021-03-20 DIAGNOSIS — T63421D Toxic effect of venom of ants, accidental (unintentional), subsequent encounter: Secondary | ICD-10-CM | POA: Diagnosis not present

## 2021-03-20 DIAGNOSIS — J3089 Other allergic rhinitis: Secondary | ICD-10-CM | POA: Diagnosis not present

## 2021-03-20 DIAGNOSIS — J3081 Allergic rhinitis due to animal (cat) (dog) hair and dander: Secondary | ICD-10-CM | POA: Diagnosis not present

## 2021-03-27 DIAGNOSIS — J3081 Allergic rhinitis due to animal (cat) (dog) hair and dander: Secondary | ICD-10-CM | POA: Diagnosis not present

## 2021-03-27 DIAGNOSIS — J301 Allergic rhinitis due to pollen: Secondary | ICD-10-CM | POA: Diagnosis not present

## 2021-03-27 DIAGNOSIS — J3089 Other allergic rhinitis: Secondary | ICD-10-CM | POA: Diagnosis not present

## 2021-08-15 ENCOUNTER — Other Ambulatory Visit: Payer: Self-pay | Admitting: Surgery

## 2021-08-15 DIAGNOSIS — E041 Nontoxic single thyroid nodule: Secondary | ICD-10-CM

## 2021-08-18 ENCOUNTER — Other Ambulatory Visit: Payer: Medicare Other

## 2021-08-18 ENCOUNTER — Ambulatory Visit
Admission: RE | Admit: 2021-08-18 | Discharge: 2021-08-18 | Disposition: A | Discharge status: Home / Self Care | Payer: Medicare Other | Source: Ambulatory Visit | Attending: Surgery | Admitting: Surgery

## 2021-08-18 DIAGNOSIS — E041 Nontoxic single thyroid nodule: Secondary | ICD-10-CM

## 2023-01-16 IMAGING — US US THYROID
1 series · 12 of 25 positions shown · non-contrast
Comparison: August 2018, multiple priors

CLINICAL DATA: Prior ultrasound follow-up.

EXAM:
THYROID ULTRASOUND
TECHNIQUE: Ultrasound examination of the thyroid gland and adjacent soft
tissues was performed.

[Series 1: us thyroid · 0.05mm/px · 12 of 49 slices shown]
[im 3/49]
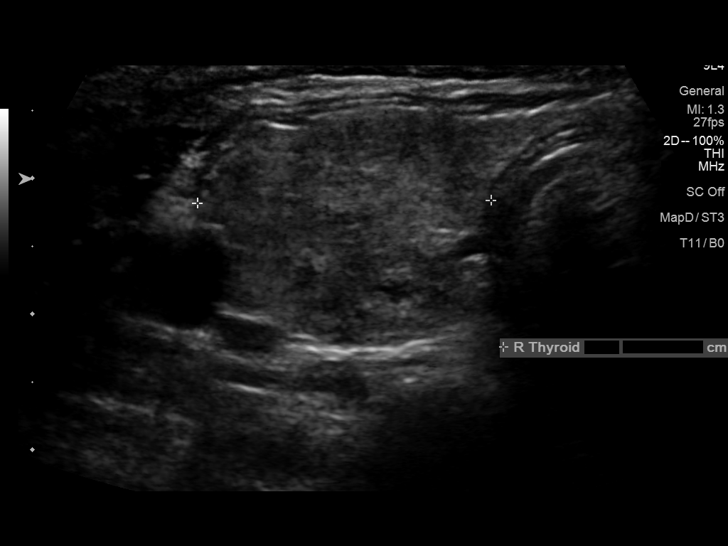
[im 7/49]
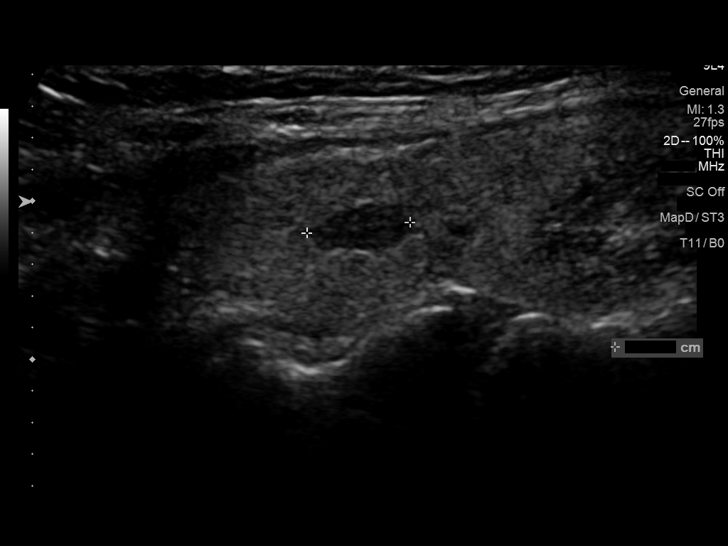
[im 11/49]
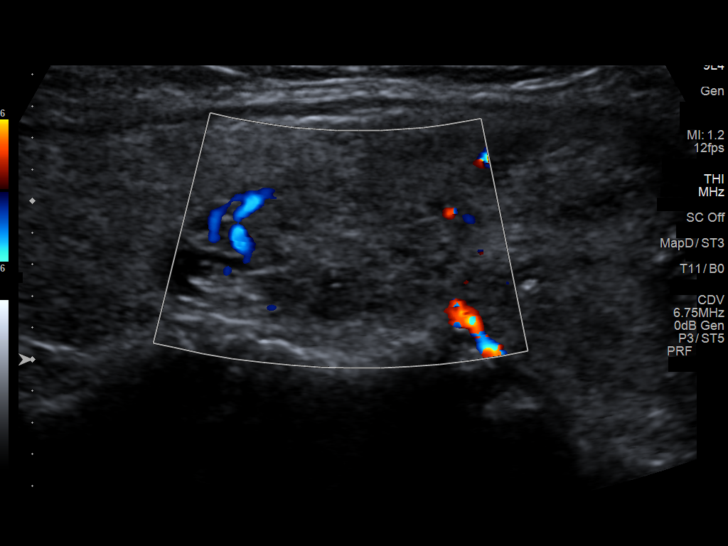
[im 15/49]
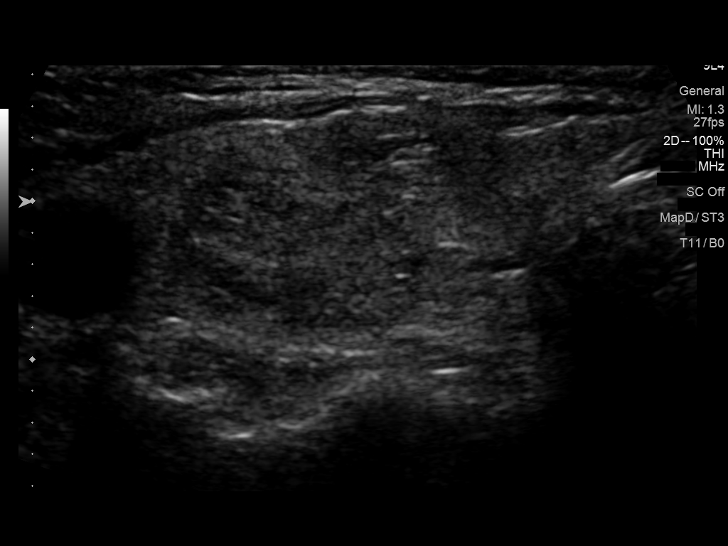
[im 19/49]
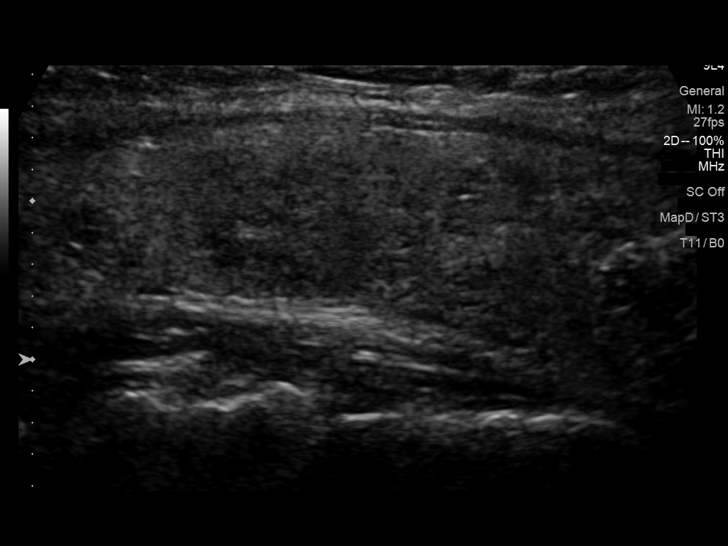
[im 23/49]
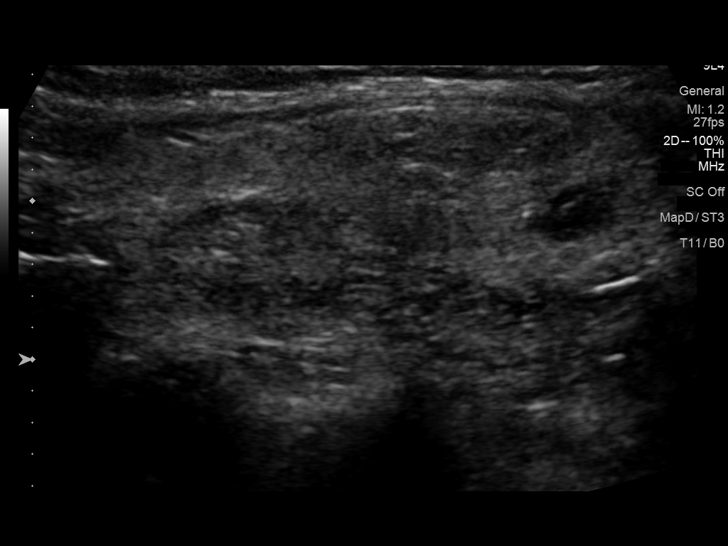
[im 27/49]
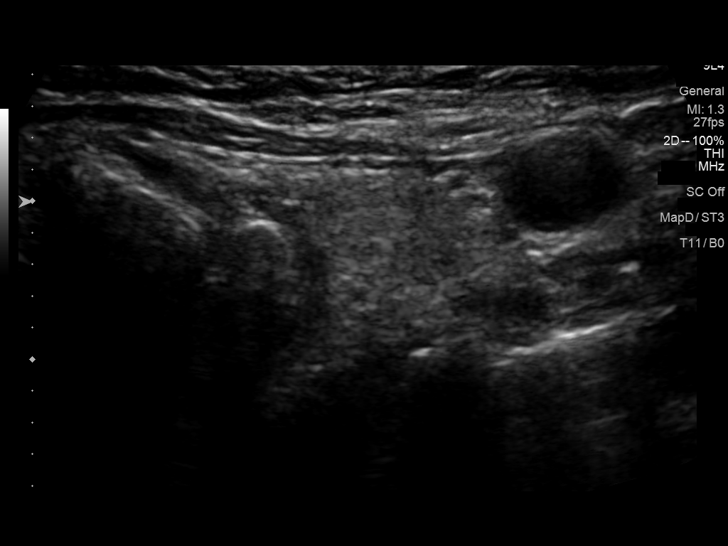
[im 31/49]
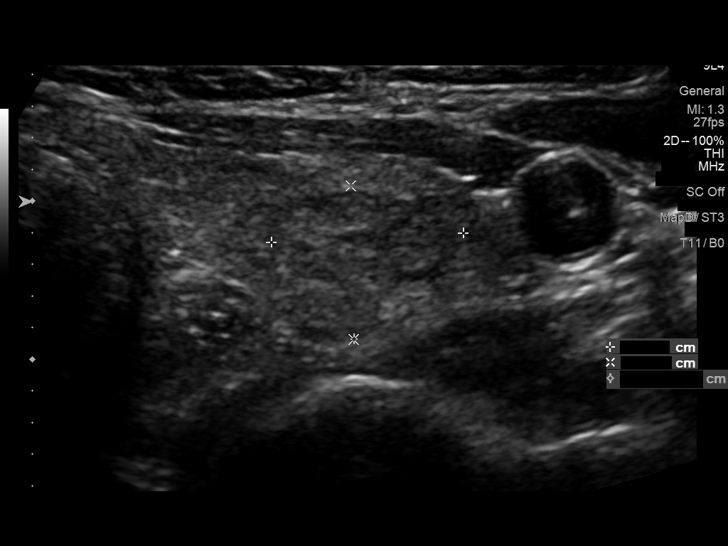
[im 35/49]
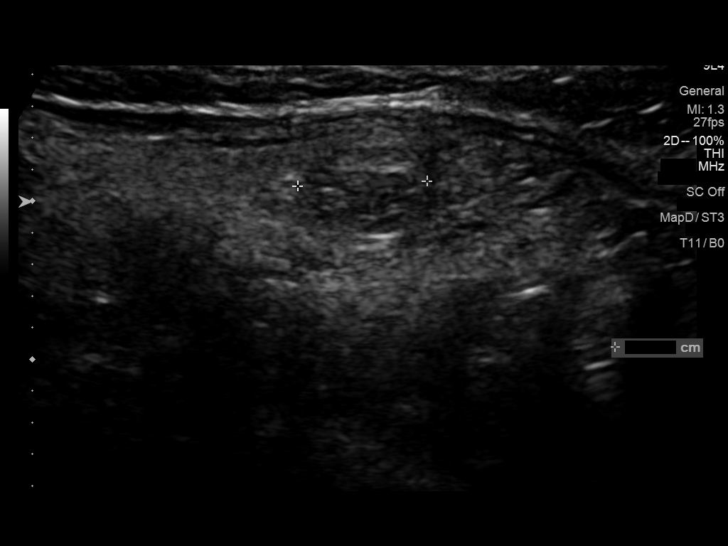
[im 39/49]
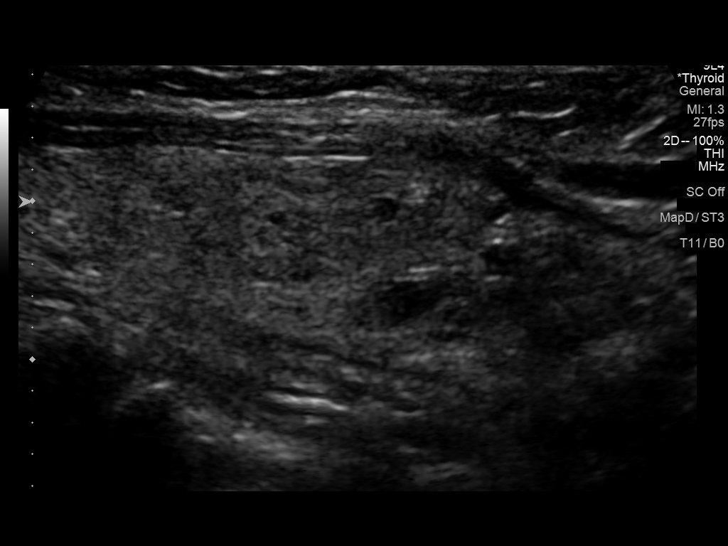
[im 43/49]
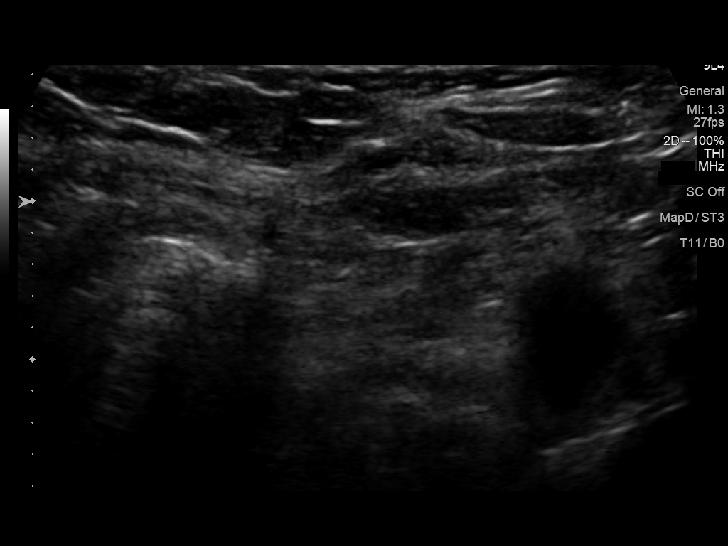
[im 47/49]
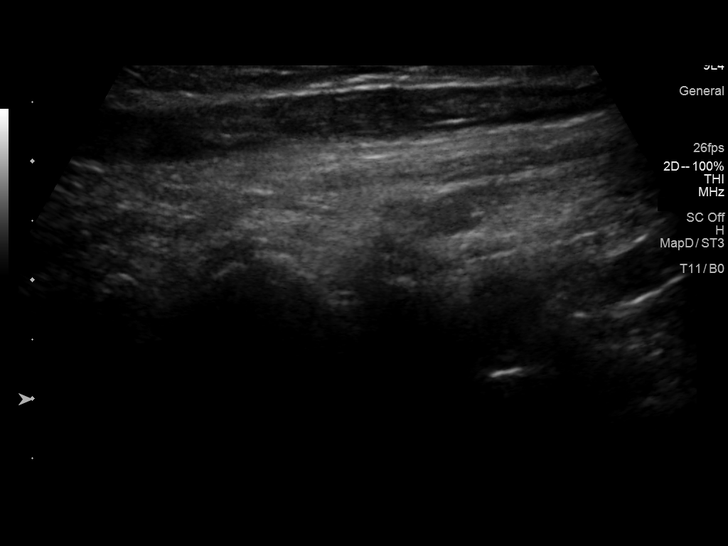

[12 of 25 positions shown; findings below may reference images not displayed]

FINDINGS: Parenchymal Echotexture: Moderately heterogenous

Isthmus: 0.3 cm

Right lobe: 5.4 x 1.7 x 2.2 cm

Left lobe: 4.7 x 1.5 x 2.0 cm

_________________________________________________________

Estimated total number of nodules >/= 1 cm: 6

Number of spongiform nodules >/=  2 cm not described below (TR1): 0

Number of mixed cystic and solid nodules >/= 1.5 cm not described
below (TR2): 0

_________________________________________________________

Nodule labeled 1 is a small subcentimeter solid hypoechoic TR 4
nodule in the superior right thyroid lobe measuring up to 0.7 cm,
similar in size to prior study. Given size (<0.9 cm) and appearance,
this nodule does NOT meet TI-RADS criteria for biopsy or dedicated
follow-up.

Nodule labeled 2 is a solid nodule in the mid right thyroid lobe
measuring up to 1.9 cm, previously biopsied. It remains similar in
size and morphology.

Nodule labeled 3 is a solid isoechoic TR 3 nodule in the mid right
thyroid lobe measuring up to 1.7 cm, previously 1.6 cm. It appears
to have measured up to 1.8 cm in 4785. It is not significantly
changed, and can be considered benign given greater than 5 year
stability.

Nodule labeled 4 is a solid nodule in the inferior right thyroid
lobe measuring up to 1.4 cm, previously biopsied. It remains similar
in size and morphology.

Nodule labeled 5 is a small subcentimeter solid hypoechoic TR 4
nodule in the superior left thyroid lobe. Given size (<0.9 cm) and
appearance, this nodule does NOT meet TI-RADS criteria for biopsy or
dedicated follow-up.

Nodule labeled 6 is a solid isoechoic TR 3 nodule in the mid left
thyroid lobe measuring 1.5 x 1.2 x 1.0 cm, previously 1.8 cm in
7676. It does not appear to have been definitively visualized on the
4785 exam. This exam marks 3 year stability. *Given size (>/= 1.5 -
2.4 cm) and appearance, a follow-up ultrasound in 1 year should be
considered based on TI-RADS criteria.

Nodule labeled 7 is a solid isoechoic TR 3 nodule in the mid left
thyroid lobe measuring 1.2 x 0.8 x 0.6 cm, previously measuring up
to 1.7 cm in 7676. It does not appear to have been definitively
visualized on the 4785 exam. This exam marks 3 year stability.
IMPRESSION: 1. Multinodular thyroid gland. No new worrisome or enlarging thyroid
nodules.
2. Nodules labeled 2 and 4 in the right thyroid lobe were previously
biopsied, and remain similar in size and morphology. Correlate with
biopsy results.
3. Nodule labeled 6 and 7 in the left thyroid lobe are similar to
slightly decreased in size compared to 7676 exam, demonstrating 3
year stability. Follow-up ultrasound in 1 year is still recommended,
until a follow-up of 5 years is completed.
4. Additional small and/or benign nodules as described do not meet
criteria for further dedicated follow-up or biopsy.

The above is in keeping with the ACR TI-RADS recommendations - [HOSPITAL] 4785;[DATE].

## 2023-03-17 ENCOUNTER — Other Ambulatory Visit: Payer: Self-pay | Admitting: Obstetrics and Gynecology

## 2023-03-17 DIAGNOSIS — Z87891 Personal history of nicotine dependence: Secondary | ICD-10-CM

## 2023-04-08 ENCOUNTER — Ambulatory Visit
Admission: RE | Admit: 2023-04-08 | Discharge: 2023-04-08 | Disposition: A | Discharge status: Home / Self Care | Payer: Medicare Other | Source: Ambulatory Visit | Attending: Obstetrics and Gynecology | Admitting: Obstetrics and Gynecology

## 2023-04-08 DIAGNOSIS — Z87891 Personal history of nicotine dependence: Secondary | ICD-10-CM

## 2023-06-21 NOTE — Progress Notes (Unsigned)
 Cardiology Office Note:    Date:  06/25/2023   ID:  Disha, Cottam 17-Dec-1950, MRN [id number]  PCP:  Eartha Inch, MD   [facility] Providers Cardiologist:  None     Referring MD: Richardean Chimera, MD   Chief Complaint  Patient presents with   aortic atherosclerosis    History of Present Illness:    Janet Holland is a 73 y.o. female seen at the request of Dr Arelia Sneddon for evaluation of aortic atheroslerosis.  She has a history of HLD. She recently had a chest CT for cancer screening (quit smoking in 1980). She was noted to have some calcifications in the aorta but interestingly no significant coronary calcification.  She is active and eats a healthy diet. No chest pain, dyspnea or palpitations. No claudication  Past Medical History:  Diagnosis Date   Hyperlipidemia    Osteopenia    Thyroid disease     Past Surgical History:  Procedure Laterality Date   APPENDECTOMY     MYOTOMY     RHINOPLASTY     TONSILLECTOMY      Current Medications: Current Meds  Medication Sig   Cholecalciferol (VITAMIN D3) 125 MCG (5000 UT) CAPS    cycloSPORINE (RESTASIS) 0.05 % ophthalmic emulsion 1 drop 2 (two) times daily.   EPINEPHrine 0.3 mg/0.3 mL IJ SOAJ injection INJECT THE CONTENTS OF 1 PEN AS NEEDED FOR ALLERGIC REACTION   fexofenadine (ALLEGRA) 180 MG tablet Take by mouth.   fluticasone (FLONASE) 50 MCG/ACT nasal spray one spray by Both Nostrils route daily.   Glycerin, PF, (OPTASE COMFORT DRY EYE) 1 % SOLN    Multiple Minerals-Vitamins (CALCIUM & VIT D3 BONE HEALTH PO) Take by mouth.   neomycin-polymyxin b-dexamethasone (MAXITROL) 3.5-10000-0.1 SUSP Place 1 drop into the right eye 2 (two) times daily.   Olopatadine HCl 0.2 % SOLN INSTILL 1 DROP INTO AFFECTED EYE ONCE DAILY     Allergies:   Patient has no known allergies.   Social History   Socioeconomic History   Marital status: Married    Spouse name: Not on file   Number of children: Not on file   Years  of education: Not on file   Highest education level: Not on file  Occupational History   Not on file  Tobacco Use   Smoking status: Former    Current packs/day: 0.00    Types: Cigarettes    Quit date: 03/30/1978    Years since quitting: 45.2   Smokeless tobacco: Never  Substance and Sexual Activity   Alcohol use: Yes   Drug use: No   Sexual activity: Not on file  Other Topics Concern   Not on file  Social History Narrative   Not on file   Social Drivers of Health   Financial Resource Strain: Low Risk  (07/16/2022)   Received from [facility], [facility]   Overall Financial Resource Strain (CARDIA)    Difficulty of Paying Living Expenses: Not hard at all  Food Insecurity: No Food Insecurity (07/16/2022)   Received from [facility], [facility]   Hunger Vital Sign    Worried About Running Out of Food in the Last Year: Never true    Ran Out of Food in the Last Year: Never true  Transportation Needs: No Transportation Needs (07/16/2022)   Received from [facility], [facility]   PRAPARE - Transportation    Lack of Transportation (Medical): No    Lack of Transportation (Non-Medical): No  Physical  Activity: Insufficiently Active (07/16/2022)   Received from [facility], [facility]   Exercise Vital Sign    Days of Exercise per Week: 4 days    Minutes of Exercise per Session: 20 min  Stress: No Stress Concern Present (07/16/2022)   Received from [facility], [facility] - Occupational Stress Questionnaire    Feeling of Stress : Only a little  Social Connections: Socially Integrated (07/16/2022)   Received from [facility], [facility]   Social Network    How would you rate your social network (family, work, friends)?: Good participation with social networks     Family History: The patient's family history includes Breast cancer in her sister; Colon cancer in her paternal grandfather; Dementia in her mother; Heart  attack (age of onset: 84) in her father; Lymphoma in her sister; Melanoma in her brother; Ovarian cancer in her maternal grandmother.  ROS:   Please see the history of present illness.     All other systems reviewed and are negative.  EKGs/Labs/Other Studies Reviewed:    The following studies were reviewed today: EKG Interpretation Date/Time:  Friday June 25 2023 14:02:36 EDT Ventricular Rate:  71 PR Interval:  148 QRS Duration:  74 QT Interval:  368 QTC Calculation: 399 R Axis:   83  Text Interpretation: Normal sinus rhythm Normal ECG No previous ECGs available Confirmed by [place], Lonza Shimabukuro [phone]) on 06/25/2023 2:03:31 PM   EKG Interpretation Date/Time:  Friday June 25 2023 14:02:36 EDT Ventricular Rate:  71 PR Interval:  148 QRS Duration:  74 QT Interval:  368 QTC Calculation: 399 R Axis:   83  Text Interpretation: Normal sinus rhythm Normal ECG No previous ECGs available Confirmed by [place], Verlan Grotz [phone]) on 06/25/2023 2:03:31 PM    Recent Labs: No results found for requested labs within last 365 days.  Recent Lipid Panel No results found for: "CHOL", "TRIG", "HDL", "CHOLHDL", "VLDL", "LDLCALC", "LDLDIRECT" Dated 07/16/22: cholesterol 247, triglycerides 69, HDL 119, LDL 117. TSH and CMET normal  Risk Assessment/Calculations:                Physical Exam:    VS:  BP 130/80   Pulse 72   Ht 5\' 3"  (1.6 m)   Wt 139 lb 9.6 oz (63.3 kg)   SpO2 98%   BMI 24.73 kg/m     Wt Readings from Last 3 Encounters:  06/25/23 139 lb 9.6 oz (63.3 kg)  06/02/18 150 lb (68 kg)  05/02/18 150 lb (68 kg)     GEN:  Well nourished, well developed in no acute distress HEENT: Normal NECK: No JVD; No carotid bruits LYMPHATICS: No lymphadenopathy CARDIAC: RRR, no murmurs, rubs, gallops RESPIRATORY:  Clear to auscultation without rales, wheezing or rhonchi  ABDOMEN: Soft, non-tender, non-distended MUSCULOSKELETAL:  No edema; No deformity  SKIN: Warm and dry NEUROLOGIC:  Alert and  oriented x 3 PSYCHIATRIC:  Normal affect   ASSESSMENT:    1. Aortic atherosclerosis (HCC)   2. Hypercholesteremia    PLAN:    In order of problems listed above:  Aortic atherosclerosis. Reviewed CT with patient. Coronary calcium score is 0 which is reassuring. I did inform her that current guidelines would recommend she take statin therapy. She is planning to have repeat labs done with PCP. No ASA indicated. No further cardiac work up needed HLD           Medication Adjustments/Labs and Tests Ordered: Current medicines are reviewed at  length with the patient today.  Concerns regarding medicines are outlined above.  Orders Placed This Encounter  Procedures   EKG 12-Lead   No orders of the defined types were placed in this encounter.   Patient Instructions  Medication Instructions:  Continue same medications  Lab Work: None ordered  Testing/Procedures: None ordered  Follow-Up: At [facility], you and your health needs are our priority.  As part of our continuing mission to provide you with exceptional heart care, our providers are all part of one team.  This team includes your primary Cardiologist (physician) and Advanced Practice Providers or APPs (Physician Assistants and Nurse Practitioners) who all work together to provide you with the care you need, when you need it.  Your next appointment:  As Needed    Provider:  Dr.Teanna Elem    We recommend signing up for the patient portal called "MyChart".  Sign up information is provided on this After Visit Summary.  MyChart is used to connect with patients for Virtual Visits (Telemedicine).  Patients are able to view lab/test results, encounter notes, upcoming appointments, etc.  Non-urgent messages can be sent to your provider as well.   To learn more about what you can do with MyChart, go to [email or web address].         1st Floor: - Lobby - Registration  - Pharmacy  - Lab - Cafe  2nd Floor: - PV  Lab - Diagnostic Testing (echo, CT, nuclear med)  3rd Floor: - Vacant  4th Floor: - TCTS (cardiothoracic surgery) - AFib Clinic - Structural Heart Clinic - Vascular Surgery  - Vascular Ultrasound  5th Floor: - HeartCare Cardiology (general and EP) - Clinical Pharmacy for coumadin, hypertension, lipid, weight-loss medications, and med management appointments    Valet parking services will be available as well.      Signed, Nixon Sparr [place], MD  06/25/2023 2:17 PM     HeartCare

## 2023-06-25 ENCOUNTER — Ambulatory Visit: Payer: Medicare Other | Attending: Cardiology | Admitting: Cardiology

## 2023-06-25 ENCOUNTER — Encounter: Payer: Self-pay | Admitting: Cardiology

## 2023-06-25 VITALS — BP 130/80 | HR 72 | Ht 63.0 in | Wt 139.6 lb

## 2023-06-25 DIAGNOSIS — I700 Atherosclerosis of aorta: Secondary | ICD-10-CM | POA: Diagnosis present

## 2023-06-25 DIAGNOSIS — E7800 Pure hypercholesterolemia, unspecified: Secondary | ICD-10-CM | POA: Diagnosis present

## 2023-06-25 NOTE — Patient Instructions (Signed)
 Medication Instructions:  Continue same medications  Lab Work: None ordered  Testing/Procedures: None ordered  Follow-Up: At [facility], you and your health needs are our priority.  As part of our continuing mission to provide you with exceptional heart care, our providers are all part of one team.  This team includes your primary Cardiologist (physician) and Advanced Practice Providers or APPs (Physician Assistants and Nurse Practitioners) who all work together to provide you with the care you need, when you need it.  Your next appointment:  As Needed    Provider:  Dr.Jordan   We recommend signing up for the patient portal called "MyChart".  Sign up information is provided on this After Visit Summary.  MyChart is used to connect with patients for Virtual Visits (Telemedicine).  Patients are able to view lab/test results, encounter notes, upcoming appointments, etc.  Non-urgent messages can be sent to your provider as well.   To learn more about what you can do with MyChart, go to [email or web address].        1st Floor: - Lobby - Registration  - Pharmacy  - Lab - Cafe  2nd Floor: - PV Lab - Diagnostic Testing (echo, CT, nuclear med)  3rd Floor: - Vacant  4th Floor: - TCTS (cardiothoracic surgery) - AFib Clinic - Structural Heart Clinic - Vascular Surgery  - Vascular Ultrasound  5th Floor: - HeartCare Cardiology (general and EP) - Clinical Pharmacy for coumadin, hypertension, lipid, weight-loss medications, and med management appointments    Valet parking services will be available as well.

## 2024-03-16 ENCOUNTER — Other Ambulatory Visit: Payer: Self-pay | Admitting: Obstetrics and Gynecology

## 2024-03-16 DIAGNOSIS — R918 Other nonspecific abnormal finding of lung field: Secondary | ICD-10-CM

## 2024-04-18 ENCOUNTER — Ambulatory Visit
Admission: RE | Admit: 2024-04-18 | Discharge: 2024-04-18 | Disposition: A | Source: Ambulatory Visit | Attending: Obstetrics and Gynecology | Admitting: Obstetrics and Gynecology

## 2024-04-18 ENCOUNTER — Other Ambulatory Visit

## 2024-04-18 DIAGNOSIS — R918 Other nonspecific abnormal finding of lung field: Secondary | ICD-10-CM

## 2024-04-25 ENCOUNTER — Other Ambulatory Visit: Payer: Self-pay | Admitting: Obstetrics and Gynecology

## 2024-04-25 DIAGNOSIS — R911 Solitary pulmonary nodule: Secondary | ICD-10-CM

## 2024-07-13 ENCOUNTER — Other Ambulatory Visit
